# Patient Record
Sex: Male | Born: 1940 | Race: White | Hispanic: No | Marital: Married | Smoking: Former smoker
Health system: Southern US, Community
[De-identification: ages and names within clinical notes are randomized; demographics above are authoritative.]

## PROBLEM LIST (undated history)

## (undated) DIAGNOSIS — J309 Allergic rhinitis, unspecified: Secondary | ICD-10-CM

## (undated) DIAGNOSIS — E785 Hyperlipidemia, unspecified: Secondary | ICD-10-CM

## (undated) DIAGNOSIS — L570 Actinic keratosis: Secondary | ICD-10-CM

## (undated) DIAGNOSIS — I10 Essential (primary) hypertension: Secondary | ICD-10-CM

## (undated) HISTORY — DX: Essential (primary) hypertension: I10

## (undated) HISTORY — PX: TONSILECTOMY, ADENOIDECTOMY, BILATERAL MYRINGOTOMY AND TUBES: SHX2538

## (undated) HISTORY — DX: Hyperlipidemia, unspecified: E78.5

## (undated) HISTORY — DX: Allergic rhinitis, unspecified: J30.9

## (undated) HISTORY — DX: Actinic keratosis: L57.0

## (undated) HISTORY — PX: CATARACT EXTRACTION: SUR2

## (undated) HISTORY — PX: CARPAL TUNNEL RELEASE: SHX101

---

## 2017-03-17 DIAGNOSIS — H26492 Other secondary cataract, left eye: Secondary | ICD-10-CM | POA: Diagnosis not present

## 2017-07-22 DIAGNOSIS — E782 Mixed hyperlipidemia: Secondary | ICD-10-CM | POA: Diagnosis not present

## 2017-07-22 DIAGNOSIS — E538 Deficiency of other specified B group vitamins: Secondary | ICD-10-CM | POA: Diagnosis not present

## 2017-07-22 DIAGNOSIS — I119 Hypertensive heart disease without heart failure: Secondary | ICD-10-CM | POA: Diagnosis not present

## 2017-07-22 DIAGNOSIS — I2510 Atherosclerotic heart disease of native coronary artery without angina pectoris: Secondary | ICD-10-CM | POA: Diagnosis not present

## 2018-03-05 DIAGNOSIS — E538 Deficiency of other specified B group vitamins: Secondary | ICD-10-CM | POA: Diagnosis not present

## 2018-03-05 DIAGNOSIS — I2510 Atherosclerotic heart disease of native coronary artery without angina pectoris: Secondary | ICD-10-CM | POA: Diagnosis not present

## 2018-03-05 LAB — BASIC METABOLIC PANEL
BUN: 19 (ref 4–21)
Creatinine: 1.1 (ref 0.6–1.3)
Glucose: 98
Potassium: 3.9 (ref 3.4–5.3)
Sodium: 141 (ref 137–147)

## 2018-03-05 LAB — CBC
BASO#: 0 /uL
BASO%: 1 %
EOS%: 2 %
Eosinophil #: 0.17
LYMPH%: 20 %
MCH: 33.3 pg — AB (ref 26.5–32.5)
MCHC: 34.3 g/dL (ref 32.5–36.9)
MCV: 97 fL (ref 76.0–111.0)
MONO#: 0.7
Monocyte %: 10
Neutrophil #: 4.9
Neutrophil %: 67
RBC: 4.06 MIL/uL — AB (ref 4.2–5.8)
RDW: 12.2 % (ref 11.6–14)
lymph#: 1.5

## 2018-03-05 LAB — TSH: TSH: 2.97 (ref 0.41–5.90)

## 2018-03-05 LAB — LIPID PANEL
Cholesterol: 158 (ref 0–200)
HDL: 58 (ref 35–70)
LDL Cholesterol: 70
Triglycerides: 150 (ref 40–160)

## 2018-03-05 LAB — CBC AND DIFFERENTIAL
HCT: 39 — AB (ref 41–53)
Hemoglobin: 13.5 (ref 13.5–17.5)
Platelets: 256 (ref 150–399)
WBC: 7.4

## 2018-03-05 LAB — COMPREHENSIVE METABOLIC PANEL
Albumin/Globulin Ratio: 1.8
Albumin: 4.8
BUN/Creatinine Ratio: 17
Calcium: 9.8
Carbon Dioxide, Total: 27
Chloride: 101
EGFR (African American): 81
EGFR (Non-African Amer.): 67

## 2018-03-05 LAB — HEPATIC FUNCTION PANEL
ALT: 22 (ref 10–40)
AST: 23 (ref 14–40)
Alkaline Phosphatase: 87 (ref 25–125)
Bilirubin, Total: 0.7

## 2018-03-05 LAB — VITAMIN B12: Vitamin B-12: 242.2

## 2018-03-11 DIAGNOSIS — E782 Mixed hyperlipidemia: Secondary | ICD-10-CM | POA: Diagnosis not present

## 2018-03-11 DIAGNOSIS — I2510 Atherosclerotic heart disease of native coronary artery without angina pectoris: Secondary | ICD-10-CM | POA: Diagnosis not present

## 2018-03-11 DIAGNOSIS — Z0000 Encounter for general adult medical examination without abnormal findings: Secondary | ICD-10-CM | POA: Diagnosis not present

## 2018-03-11 DIAGNOSIS — I119 Hypertensive heart disease without heart failure: Secondary | ICD-10-CM | POA: Diagnosis not present

## 2018-03-18 DIAGNOSIS — D2230 Melanocytic nevi of unspecified part of face: Secondary | ICD-10-CM | POA: Diagnosis not present

## 2018-03-18 DIAGNOSIS — L578 Other skin changes due to chronic exposure to nonionizing radiation: Secondary | ICD-10-CM | POA: Diagnosis not present

## 2018-03-18 DIAGNOSIS — D229 Melanocytic nevi, unspecified: Secondary | ICD-10-CM | POA: Diagnosis not present

## 2018-03-18 DIAGNOSIS — Z1283 Encounter for screening for malignant neoplasm of skin: Secondary | ICD-10-CM | POA: Diagnosis not present

## 2018-03-18 DIAGNOSIS — D1801 Hemangioma of skin and subcutaneous tissue: Secondary | ICD-10-CM | POA: Diagnosis not present

## 2018-04-07 ENCOUNTER — Encounter: Payer: Self-pay | Admitting: Urology

## 2018-04-07 ENCOUNTER — Ambulatory Visit: Payer: Medicare Other | Admitting: Urology

## 2018-04-07 DIAGNOSIS — B356 Tinea cruris: Secondary | ICD-10-CM

## 2018-04-07 DIAGNOSIS — N4883 Acquired buried penis: Secondary | ICD-10-CM | POA: Diagnosis not present

## 2018-04-07 DIAGNOSIS — N432 Other hydrocele: Secondary | ICD-10-CM | POA: Diagnosis not present

## 2018-04-07 NOTE — Progress Notes (Signed)
04/07/2018 1:52 PM   Isaac Watkins 12-09-1940 [id number]  Referring provider: Trude Mcburney, MD [address] [place], [place] [address]  Chief Complaint  Patient presents with  . pevic swelling    HPI:  78 yo male seen for scrotal swelling. He's noticed on and off since 7th grade but worse over past few months. He has to use baby powder because of scrotal sweating. He has an adequate flow but frequency and noc x 4-5. He reports DRE was normal 3-4 weeks ago.   Modifying factors: There are no other modifying factors  Associated signs and symptoms: There are no other associated signs and symptoms Aggravating and relieving factors: There are no other aggravating or relieving factors Severity: Moderate Duration: Persistent  PMH: Past Medical History:  Diagnosis Date  . Hypertension      Surgical History Collapse by Default  Collapse by Default     Date Unknown Carpal tunnel release  Date Unknown Cataract extraction  Date Unknown Tonsilectomy, adenoidectomy, bilateral myringotomy and tubes     Home Medications:  Allergies as of 04/07/2018   No Known Allergies     Medication List       Accurate as of April 07, 2018  1:52 PM. Always use your most recent med list.        diltiazem 300 MG 24 hr capsule Commonly known as:  TIAZAC   hydrochlorothiazide 25 MG tablet Commonly known as:  HYDRODIURIL   losartan 100 MG tablet Commonly known as:  COZAAR       Allergies: No Known Allergies  Family History: Family History  Problem Relation Age of Onset  . Pancreatic cancer Father   . Prostate cancer Neg Hx   . Bladder Cancer Neg Hx   . Kidney cancer Neg Hx     Social History:  reports that he has quit smoking. He has never used smokeless tobacco. He reports current alcohol use. He reports that he does not use drugs.  ROS:                                        Physical Exam: There were no vitals taken for this  visit.  Constitutional:  Alert and oriented, No acute distress. HEENT: [place] AT, moist mucus membranes.  Trachea midline, no masses. Cardiovascular: No clubbing, cyanosis, or edema. Respiratory: Normal respiratory effort, no increased work of breathing. GI: Abdomen is soft, nontender, nondistended, no abdominal masses GU: No CVA tenderness Penis: circumcised, normal foreskin and penis; Large SP fat pad and penis somewhat buried ; testicles descended bilaterally and normal. Possible small hydroceles.  Scrotum : normal  Lymph: No cervical or inguinal lymphadenopathy. Skin: No rashes, bruises or suspicious lesions. Neurologic: Grossly intact, no focal deficits, moving all 4 extremities. Psychiatric: Normal mood and affect.  Laboratory Data: No results found for: WBC, HGB, HCT, MCV, PLT  No results found for: CREATININE  No results found for: PSA  No results found for: TESTOSTERONE  No results found for: HGBA1C  Urinalysis No results found for: COLORURINE, APPEARANCEUR, LABSPEC, PHURINE, GLUCOSEU, HGBUR, BILIRUBINUR, KETONESUR, PROTEINUR, UROBILINOGEN, NITRITE, LEUKOCYTESUR  No results found for: LABMICR, WBCUA, RBCUA, LABEPIT, MUCUS, BACTERIA  Pertinent Imaging:  No results found for this or any previous visit. No results found for this or any previous visit. No results found for this or any previous visit. No results found for this or any  previous visit. No results found for this or any previous visit. No results found for this or any previous visit. No results found for this or any previous visit. No results found for this or any previous visit.  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 Ileene Rubens at [facility].   Hydrocele - not large enough to risk hydrocelectomy  Tinea cruris - OTC antifungals recommended   Discussed PSA screening and he declined.   There are no diagnoses linked to this encounter.  No follow-ups on file.  Jerilee Field,  MD  [facility] [address] [place], [place] [address] [phone]

## 2018-04-07 NOTE — Patient Instructions (Signed)
Jock Itch  Jock itch is an itchy rash in the groin and upper thigh area. It is a skin infection that is caused by a type of germ that lives in dark, damp places (fungus). The rash usually goes away in 2-3 weeks with treatment. Follow these instructions at home: Skin care  Use skin creams, ointments, or powders exactly as told by your doctor.  Wear loose-fitting clothes. Clothes should not rub against your groin area. Men should wear boxer shorts or loose-fitting underwear.  Keep your groin area clean and dry. ? Change your underwear every day. ? Change out of wet bathing suits as soon as you can. ? After bathing, use a separate towel to dry your groin area. Dry the area gently and completely.  Avoid hot baths and showers. Hot water can make itching worse.  Do not scratch the area. General instructions  Take and apply over-the-counter and prescription medicines only as told by your doctor.  Do not share towels or clothing with other people.  Wash your hands often with soap and water, especially after touching your groin area. If you do not have soap and water, use alcohol-based hand sanitizer. Contact a doctor if:  Your rash: ? Gets worse. ? Does not get better after 2 weeks of treatment. ? Spreads. ? Comes back after treatment is done.  You have any of the following: ? A fever. ? New or worsening redness, swelling, or pain around your rash. ? Fluid, blood, or pus coming from your rash. Summary  Jock itch is an itchy rash. It affects the groin and upper thigh area.  Jock itch usually goes away in 2-3 weeks with treatment.  Keep your groin area clean and dry. This information is not intended to replace advice given to you by your health care provider. Make sure you discuss any questions you have with your health care provider. Document Released: 05/21/2009 Document Revised: 02/04/2017 Document Reviewed: 02/04/2017 Elsevier Interactive Patient Education  2019 Elsevier  Inc.   Hydrocele, Adult A hydrocele is a collection of fluid in the loose pouch of skin that holds the testicles (scrotum). This may happen because:  The amount of fluid produced in the scrotum is not absorbed by the rest of the body.  Fluid from the abdomen fills the scrotum. Normally, the testicles develop in the abdomen then move (drop) into to the scrotum before birth. The tube that the testicles travel through usually closes after the testicles drop. If the tube does not close, fluid from the abdomen can fill the scrotum. This is less common in adults. What are the causes? The cause of a hydrocele in adults is usually not known. However, it may be caused by:  An injury to the scrotum.  An infection (epididymitis).  Decreased blood flow to the scrotum.  Twisting of a testicle (testicular torsion).  A birth defect.  A tumor or cancer of the testicle. What are the signs or symptoms? A hydrocele feels like a water-filled balloon. It may also feel heavy. Other symptoms include:  Swelling of the scrotum. The swelling may decrease when you lie down. You may also notice more swelling at night than in the morning.  Swelling of the groin.  Mild discomfort in the scrotum.  Pain. This can develop if the hydrocele was caused by infection or twisting. The larger the hydrocele, the more likely you are to have pain. How is this diagnosed? This condition may be diagnosed based on:  Physical exam.  Medical history.  You may also have other tests, including:  Imaging tests, such as ultrasound.  Blood or urine tests. How is this treated? Most hydroceles go away on their own. If you have no discomfort or pain, your health care provider may suggest close monitoring of your condition (called watch and wait or watchful waiting) until the condition goes away or symptoms develop. If treatment is needed, it may include:  Treating an underlying condition. This may include using an antibiotic  medicine to treat an infection.  Surgery to stop fluid from collecting in the scrotum.  Surgery to drain the fluid. Options include: ? Needle aspiration. A needle is used to drain fluid. However, the fluid buildup will come back quickly. ? Hydrocelectomy. For this procedure, an incision is made in the scrotum to remove the fluid sac. Follow these instructions at home:  Watch the hydrocele for any changes.  Take over-the-counter and prescription medicines only as told by your health care provider.  If you were prescribed an antibiotic medicine, use it as told by your health care provider. Do not stop taking the antibiotic even if you start to feel better.  Keep all follow-up visits as told by your health care provider. This is important. Contact a health care provider if:  You notice any changes in the hydrocele.  The swelling in your scrotum or groin gets worse.  The hydrocele becomes red, firm, painful, or tender to the touch.  You have a fever. Get help right away if you:  Develop a lot of pain, or your pain becomes worse. Summary  A hydrocele is a collection of fluid in the loose pouch of skin that holds the testicles (scrotum).  Hydroceles can cause swelling, discomfort, and sometimes pain.  In adults, the cause of a hydrocele usually is not known. However, it is sometimes caused by an infection or a rotation and twisting of the scrotum.  Treatment is usually not needed. Hydroceles often go away on their own. If a hydrocele causes pain, treatment may be given to ease the pain. This information is not intended to replace advice given to you by your health care provider. Make sure you discuss any questions you have with your health care provider. Document Released: 08/14/2009 Document Revised: 03/07/2017 Document Reviewed: 03/07/2017 Elsevier Interactive Patient Education  2019 [employer].

## 2018-05-04 ENCOUNTER — Ambulatory Visit: Payer: Self-pay | Admitting: Urology

## 2018-06-23 ENCOUNTER — Ambulatory Visit: Payer: Self-pay | Admitting: Family Medicine

## 2018-07-16 ENCOUNTER — Ambulatory Visit: Payer: Self-pay | Admitting: Family Medicine

## 2018-08-05 DIAGNOSIS — L820 Inflamed seborrheic keratosis: Secondary | ICD-10-CM | POA: Diagnosis not present

## 2018-08-05 DIAGNOSIS — L578 Other skin changes due to chronic exposure to nonionizing radiation: Secondary | ICD-10-CM | POA: Diagnosis not present

## 2018-09-20 ENCOUNTER — Ambulatory Visit (INDEPENDENT_AMBULATORY_CARE_PROVIDER_SITE_OTHER): Payer: Medicare Other | Admitting: Family Medicine

## 2018-09-20 ENCOUNTER — Other Ambulatory Visit: Payer: Self-pay

## 2018-09-20 ENCOUNTER — Encounter: Payer: Self-pay | Admitting: Family Medicine

## 2018-09-20 VITALS — BP 126/78 | HR 75 | Temp 97.9°F | Resp 16 | Ht 72.0 in | Wt 234.0 lb

## 2018-09-20 DIAGNOSIS — F4323 Adjustment disorder with mixed anxiety and depressed mood: Secondary | ICD-10-CM | POA: Diagnosis not present

## 2018-09-20 DIAGNOSIS — F4320 Adjustment disorder, unspecified: Secondary | ICD-10-CM | POA: Insufficient documentation

## 2018-09-20 DIAGNOSIS — N401 Enlarged prostate with lower urinary tract symptoms: Secondary | ICD-10-CM | POA: Insufficient documentation

## 2018-09-20 DIAGNOSIS — I7090 Unspecified atherosclerosis: Secondary | ICD-10-CM

## 2018-09-20 DIAGNOSIS — I10 Essential (primary) hypertension: Secondary | ICD-10-CM | POA: Diagnosis not present

## 2018-09-20 DIAGNOSIS — R351 Nocturia: Secondary | ICD-10-CM

## 2018-09-20 DIAGNOSIS — J301 Allergic rhinitis due to pollen: Secondary | ICD-10-CM | POA: Insufficient documentation

## 2018-09-20 MED ORDER — CITALOPRAM HYDROBROMIDE 20 MG PO TABS: 20.0000 mg | ORAL_TABLET | Freq: Every day | ORAL | 3 refills | Status: DC

## 2018-09-20 MED ORDER — DILTIAZEM HCL ER BEADS 300 MG PO CP24: 300.0000 mg | ORAL_CAPSULE | Freq: Every day | ORAL | 3 refills | Status: DC

## 2018-09-20 MED ORDER — HYDROCHLOROTHIAZIDE 25 MG PO TABS: 25.0000 mg | ORAL_TABLET | Freq: Every day | ORAL | 3 refills | Status: DC

## 2018-09-20 MED ORDER — LOSARTAN POTASSIUM 100 MG PO TABS: 100.0000 mg | ORAL_TABLET | Freq: Every day | ORAL | 3 refills | Status: DC

## 2018-09-20 MED ORDER — ATORVASTATIN CALCIUM 40 MG PO TABS: 40.0000 mg | ORAL_TABLET | Freq: Every day | ORAL | 3 refills | Status: DC

## 2018-09-20 MED ORDER — TAMSULOSIN HCL 0.4 MG PO CAPS: 0.4000 mg | ORAL_CAPSULE | Freq: Every day | ORAL | 3 refills | Status: DC

## 2018-09-20 NOTE — Assessment & Plan Note (Signed)
Ongoing issue Discussed LUTS and how BPH contributes to this Discussed longer trial of Flomax and patient agrees Will resume Flomax 0.4mg  daily F/u in 6 months

## 2018-09-20 NOTE — Progress Notes (Signed)
Patient: Isaac AdasSteve Gagliano Male    DOB: [birth year]   78 y.o.   MRN: [id number] Visit Date: 09/20/2018  Today's Provider: Shirlee LatchAngela Persephanie Laatsch, MD   Chief Complaint  Patient presents with  . Establish Care   Subjective:     HPI   Patient is here to establish care.  [place]  Recently moved to the area.   Taking atorvastatin for >2896yr for atherosclerotic disease for primary prevention.  Reports cholesterol is well controlled, even before starting this medication.  Reports good compliance and denies side effects  HTN: - Medications: Diltiazem 300mg  daily, Losartan 100mg  daily, HCTZ 25mg  daily - Compliance: good - Checking BP at home: no - Denies any SOB, CP, vision changes, LE edema, medication SEs, or symptoms of hypotension - Diet: low sodium - Exercise: walking daily   Nocturia x4-5 each night. 1-2 years of duration.  Was noted to have BPH and started on Flomax by previous PCP.  Patient stopped after 1-2 weeks as he was not feeling better.  On Celexa 20mg  daily for several years.  Feels like this helps regulate mood.  Denies any depression/anxiety symptoms.    No Known Allergies   Current Outpatient Medications:  .  atorvastatin (LIPITOR) 40 MG tablet, Take 1 tablet (40 mg total) by mouth daily at 6 PM., Disp: 90 tablet, Rfl: 3 .  citalopram (CELEXA) 20 MG tablet, Take 1 tablet (20 mg total) by mouth daily., Disp: 90 tablet, Rfl: 3 .  diltiazem (TIAZAC) 300 MG 24 hr capsule, Take 1 capsule (300 mg total) by mouth daily., Disp: 90 capsule, Rfl: 3 .  hydrochlorothiazide (HYDRODIURIL) 25 MG tablet, Take 1 tablet (25 mg total) by mouth daily., Disp: 90 tablet, Rfl: 3 .  losartan (COZAAR) 100 MG tablet, Take 1 tablet (100 mg total) by mouth daily., Disp: 90 tablet, Rfl: 3 .  tamsulosin (FLOMAX) 0.4 MG CAPS capsule, Take 1 capsule (0.4 mg total) by mouth daily., Disp: 30 capsule, Rfl: 3  Review of Systems  HENT: Positive for sneezing  and tinnitus.   Respiratory: Positive for shortness of breath.   Endocrine: Positive for polyuria.  Musculoskeletal: Positive for arthralgias and back pain.  All other systems reviewed and are negative.   Social History   Tobacco Use  . Smoking status: Former Smoker    Packs/day: 1.00    Years: 40.00    Pack years: 40.00    Types: Cigarettes    Quit date: 08/18/2011    Years since quitting: 7.0  . Smokeless tobacco: Never Used  Substance Use Topics  . Alcohol use: Yes    Alcohol/week: 3.0 standard drinks    Types: 3 Standard drinks or equivalent per week      Objective:   BP 126/78 (BP Location: Left Arm, Patient Position: Sitting, Cuff Size: Large)   Pulse 75   Temp 97.9 F (36.6 C) (Oral)   Resp 16   Ht 6' (1.829 m)   Wt 234 lb (106.1 kg)   SpO2 94%   BMI 31.74 kg/m  Vitals:   09/20/18 1143  BP: 126/78  Pulse: 75  Resp: 16  Temp: 97.9 F (36.6 C)  TempSrc: Oral  SpO2: 94%  Weight: 234 lb (106.1 kg)  Height: 6' (1.829 m)     Physical Exam Vitals signs reviewed.  Constitutional:      General: He is not in acute distress.    Appearance: Normal appearance. He is  well-developed. He is not diaphoretic.  HENT:     Head: Normocephalic and atraumatic.     Right Ear: Tympanic membrane, ear canal and external ear normal.     Left Ear: Tympanic membrane, ear canal and external ear normal.     Mouth/Throat:     Pharynx: No oropharyngeal exudate.  Eyes:     General: No scleral icterus.    Conjunctiva/sclera: Conjunctivae normal.     Pupils: Pupils are equal, round, and reactive to light.  Neck:     Musculoskeletal: Neck supple.     Thyroid: No thyromegaly.  Cardiovascular:     Rate and Rhythm: Normal rate and regular rhythm.     Pulses: Normal pulses.     Heart sounds: Normal heart sounds. No murmur.  Pulmonary:     Effort: Pulmonary effort is normal. No respiratory distress.     Breath sounds: Normal breath sounds. No wheezing or rales.  Abdominal:      General: Bowel sounds are normal. There is no distension.     Palpations: Abdomen is soft.     Tenderness: There is no abdominal tenderness. There is no guarding or rebound.  Musculoskeletal:        General: No deformity.     Right lower leg: No edema.     Left lower leg: No edema.  Lymphadenopathy:     Cervical: No cervical adenopathy.  Skin:    General: Skin is warm and dry.     Capillary Refill: Capillary refill takes less than 2 seconds.     Findings: No rash.  Neurological:     Mental Status: He is alert and oriented to person, place, and time. Mental status is at baseline.  Psychiatric:        Mood and Affect: Mood normal.        Behavior: Behavior normal.        Thought Content: Thought content normal.      No results found for any visits on 09/20/18.     Assessment & Plan   Problem List Items Addressed This Visit      Cardiovascular and Mediastinum   Hypertension - Primary    Well controlled Continue current medications Recheck metabolic panel F/u in 6 months       Relevant Medications   atorvastatin (LIPITOR) 40 MG tablet   diltiazem (TIAZAC) 300 MG 24 hr capsule   hydrochlorothiazide (HYDRODIURIL) 25 MG tablet   losartan (COZAAR) 100 MG tablet   Other Relevant Orders   Basic Metabolic Panel (BMET)   Atherosclerosis    Reports normal cholesterol Will abstract last labs from PCP office Recheck FLP and CMP at next CPE      Relevant Medications   atorvastatin (LIPITOR) 40 MG tablet   diltiazem (TIAZAC) 300 MG 24 hr capsule   hydrochlorothiazide (HYDRODIURIL) 25 MG tablet   losartan (COZAAR) 100 MG tablet     Respiratory   Seasonal allergic rhinitis due to pollen    Chronic and fairly well controlled, but has daily sneezing Discussed flonase        Other   Adjustment disorder    Chronic, well controlled Continue Celexa Repeat PHQ9 at CPE      BPH associated with nocturia    Ongoing issue Discussed LUTS and how BPH contributes to this  Discussed longer trial of Flomax and patient agrees Will resume Flomax 0.4mg  daily F/u in 6 months         Approximately 45 minutes was spent in  discussion of which greater than 50% was consultation.     Return in about 6 months (around 03/23/2019) for CPE/AWV.   The entirety of the information documented in the History of Present Illness, Review of Systems and Physical Exam were personally obtained by me. Portions of this information were initially documented by April Miller, CMA and reviewed by me for thoroughness and accuracy.    Jlen Wintle, Marzella SchleinAngela M, MD MPH [facility]

## 2018-09-20 NOTE — Assessment & Plan Note (Signed)
Reports normal cholesterol Will abstract last labs from PCP office Recheck FLP and CMP at next CPE

## 2018-09-20 NOTE — Assessment & Plan Note (Signed)
Well controlled Continue current medications Recheck metabolic panel F/u in 6 months  

## 2018-09-20 NOTE — Assessment & Plan Note (Signed)
Chronic and fairly well controlled, but has daily sneezing Discussed flonase

## 2018-09-20 NOTE — Assessment & Plan Note (Signed)
Chronic, well controlled Continue Celexa Repeat PHQ9 at CPE

## 2018-09-20 NOTE — Patient Instructions (Signed)
Try flonase for sneezing  Start Flomax for urination   Benign Prostatic Hyperplasia  Benign prostatic hyperplasia (BPH) is an enlarged prostate gland that is caused by the normal aging process and not by cancer. The prostate is a walnut-sized gland that is involved in the production of semen. It is located in front of the rectum and below the bladder. The bladder stores urine and the urethra is the tube that carries the urine out of the body. The prostate may get bigger as a man gets older. An enlarged prostate can press on the urethra. This can make it harder to pass urine. The build-up of urine in the bladder can cause infection. Back pressure and infection may progress to bladder damage and kidney (renal) failure. What are the causes? This condition is part of a normal aging process. However, not all men develop problems from this condition. If the prostate enlarges away from the urethra, urine flow will not be blocked. If it enlarges toward the urethra and compresses it, there will be problems passing urine. What increases the risk? This condition is more likely to develop in men over the age of 57 years. What are the signs or symptoms? Symptoms of this condition include:  Getting up often during the night to urinate.  Needing to urinate frequently during the day.  Difficulty starting urine flow.  Decrease in size and strength of your urine stream.  Leaking (dribbling) after urinating.  Inability to pass urine. This needs immediate treatment.  Inability to completely empty your bladder.  Pain when you pass urine. This is more common if there is also an infection.  Urinary tract infection (UTI). How is this diagnosed? This condition is diagnosed based on your medical history, a physical exam, and your symptoms. Tests will also be done, such as:  A post-void bladder scan. This measures any amount of urine that may remain in your bladder after you finish urinating.  A digital  rectal exam. In a rectal exam, your health care provider checks your prostate by putting a lubricated, gloved finger into your rectum to feel the back of your prostate gland. This exam detects the size of your gland and any abnormal lumps or growths.  An exam of your urine (urinalysis).  A prostate specific antigen (PSA) screening. This is a blood test used to screen for prostate cancer.  An ultrasound. This test uses sound waves to electronically produce a picture of your prostate gland. Your health care provider may refer you to a specialist in kidney and prostate diseases (urologist). How is this treated? Once symptoms begin, your health care provider will monitor your condition (active surveillance or watchful waiting). Treatment for this condition will depend on the severity of your condition. Treatment may include:  Observation and yearly exams. This may be the only treatment needed if your condition and symptoms are mild.  Medicines to relieve your symptoms, including: ? Medicines to shrink the prostate. ? Medicines to relax the muscle of the prostate.  Surgery in severe cases. Surgery may include: ? Prostatectomy. In this procedure, the prostate tissue is removed completely through an open incision or with a laparoscope or robotics. ? Transurethral resection of the prostate (TURP). In this procedure, a tool is inserted through the opening at the tip of the penis (urethra). It is used to cut away tissue of the inner core of the prostate. The pieces are removed through the same opening of the penis. This removes the blockage. ? Transurethral incision (TUIP). In  this procedure, small cuts are made in the prostate. This lessens the prostate's pressure on the urethra. ? Transurethral microwave thermotherapy (TUMT). This procedure uses microwaves to create heat. The heat destroys and removes a small amount of prostate tissue. ? Transurethral needle ablation (TUNA). This procedure uses radio  frequencies to destroy and remove a small amount of prostate tissue. ? Interstitial laser coagulation (ILC). This procedure uses a laser to destroy and remove a small amount of prostate tissue. ? Transurethral electrovaporization (TUVP). This procedure uses electrodes to destroy and remove a small amount of prostate tissue. ? Prostatic urethral lift. This procedure inserts an implant to push the lobes of the prostate away from the urethra. Follow these instructions at home:  Take over-the-counter and prescription medicines only as told by your health care provider.  Monitor your symptoms for any changes. Contact your health care provider with any changes.  Avoid drinking large amounts of liquid before going to bed or out in public.  Avoid or reduce how much caffeine or alcohol you drink.  Give yourself time when you urinate.  Keep all follow-up visits as told by your health care provider. This is important. Contact a health care provider if:  You have unexplained back pain.  Your symptoms do not get better with treatment.  You develop side effects from the medicine you are taking.  Your urine becomes very dark or has a bad smell.  Your lower abdomen becomes distended and you have trouble passing your urine. Get help right away if:  You have a fever or chills.  You suddenly cannot urinate.  You feel lightheaded, or very dizzy, or you faint.  There are large amounts of blood or clots in the urine.  Your urinary problems become hard to manage.  You develop moderate to severe low back or flank pain. The flank is the side of your body between the ribs and the hip. These symptoms may represent a serious problem that is an emergency. Do not wait to see if the symptoms will go away. Get medical help right away. Call your local emergency services (911 in the [place] Do not drive yourself to the hospital. Summary  Benign prostatic hyperplasia (BPH) is an enlarged prostate that is  caused by the normal aging process and not by cancer.  An enlarged prostate can press on the urethra. This can make it hard to pass urine.  This condition is part of a normal aging process and is more likely to develop in men over the age of 50 years.  Get help right away if you suddenly cannot urinate. This information is not intended to replace advice given to you by your health care provider. Make sure you discuss any questions you have with your health care provider. Document Released: 02/24/2005 Document Revised: 01/19/2018 Document Reviewed: 03/31/2016 Elsevier Patient Education  2020 [employer]

## 2018-09-21 LAB — BASIC METABOLIC PANEL
BUN/Creatinine Ratio: 14 (ref 10–24)
BUN: 20 mg/dL (ref 8–27)
CO2: 22 mmol/L (ref 20–29)
Calcium: 10.1 mg/dL (ref 8.6–10.2)
Chloride: 98 mmol/L (ref 96–106)
Creatinine, Ser: 1.4 mg/dL — ABNORMAL HIGH (ref 0.76–1.27)
GFR calc Af Amer: 56 mL/min/{1.73_m2} — ABNORMAL LOW (ref >59)
GFR calc non Af Amer: 49 mL/min/{1.73_m2} — ABNORMAL LOW (ref >59)
Glucose: 92 mg/dL (ref 65–99)
Potassium: 4.1 mmol/L (ref 3.5–5.2)
Sodium: 138 mmol/L (ref 134–144)

## 2018-09-22 ENCOUNTER — Other Ambulatory Visit: Payer: Self-pay

## 2018-09-22 ENCOUNTER — Telehealth: Payer: Self-pay

## 2018-09-22 ENCOUNTER — Encounter: Payer: Self-pay | Admitting: *Deleted

## 2018-09-22 DIAGNOSIS — R7989 Other specified abnormal findings of blood chemistry: Secondary | ICD-10-CM

## 2018-09-22 NOTE — Telephone Encounter (Signed)
Labs order and slip left up front.

## 2018-09-22 NOTE — Telephone Encounter (Signed)
Patient given lab results. He states that he will try to drink more water, and will start Flomax tomorrow. He will have labs rechecked in 2 weeks.

## 2018-09-22 NOTE — Telephone Encounter (Signed)
-----   Message from Virginia Crews, MD sent at 09/22/2018 10:06 AM EDT ----- Kidney funciton has slightly worsened since last checked ~6 months ago.  Want to make sure that patient is staying well hydrated.  Start flomax as discussed and plan to recheck BMP in 2 weeks.

## 2018-09-22 NOTE — Telephone Encounter (Signed)
We can go ahead and order the BMP for elevated serum creatinine and leave lab slip up front

## 2018-09-28 DIAGNOSIS — Z961 Presence of intraocular lens: Secondary | ICD-10-CM | POA: Diagnosis not present

## 2018-09-28 DIAGNOSIS — H524 Presbyopia: Secondary | ICD-10-CM | POA: Diagnosis not present

## 2019-01-24 NOTE — Progress Notes (Signed)
Subjective:   Isaac Watkins is a 78 y.o. male who presents for an Initial Medicare Annual Wellness Visit.    This visit is being conducted through telemedicine due to the COVID-19 pandemic. This patient has given me verbal consent via doximity to conduct this visit, patient states they are participating from their home address. Some vital signs may be absent or patient reported.    Patient identification: identified by name, DOB, and current address  Review of Systems  N/A  Cardiac Risk Factors include: advanced age (>74mn, >>71women);dyslipidemia;hypertension;male gender;sedentary lifestyle    Objective:    Today's Vitals   01/25/19 1326  PainSc: 0-No pain   There is no height or weight on file to calculate BMI. Unable to obtain vitals due to visit being conducted via telephonically.   Advanced Directives 01/25/2019  Does Patient Have a Medical Advance Directive? Yes  Type of Advance Directive Living will    Current Medications (verified) Outpatient Encounter Medications as of 01/25/2019  Medication Sig  . atorvastatin (LIPITOR) 40 MG tablet Take 1 tablet (40 mg total) by mouth daily at 6 PM.  . citalopram (CELEXA) 20 MG tablet Take 1 tablet (20 mg total) by mouth daily.  .Marland Kitchendiltiazem (TIAZAC) 300 MG 24 hr capsule Take 1 capsule (300 mg total) by mouth daily.  . hydrochlorothiazide (HYDRODIURIL) 25 MG tablet Take 1 tablet (25 mg total) by mouth daily.  .Marland Kitchenlosartan (COZAAR) 100 MG tablet Take 1 tablet (100 mg total) by mouth daily.  . tamsulosin (FLOMAX) 0.4 MG CAPS capsule Take 1 capsule (0.4 mg total) by mouth daily.   No facility-administered encounter medications on file as of 01/25/2019.     Allergies (verified) Patient has no known allergies.   History: Past Medical History:  Diagnosis Date  . Allergic rhinitis   . Hyperlipidemia   . Hypertension    Past Surgical History:  Procedure Laterality Date  . CARPAL TUNNEL RELEASE Left   . CATARACT EXTRACTION     . TONSILECTOMY, ADENOIDECTOMY, BILATERAL MYRINGOTOMY AND TUBES     Family History  Problem Relation Age of Onset  . Pancreatic cancer Father   . Pancreatic disease Father   . COPD Father   . Dementia Mother   . Breast cancer Sister        BRCA 2 +  . Brain cancer Paternal Grandfather   . Prostate cancer Neg Hx   . Bladder Cancer Neg Hx   . Kidney cancer Neg Hx    Social History   Socioeconomic History  . Marital status: Married    Spouse name: Not on file  . Number of children: 3  . Years of education: Not on file  . Highest education level: Master's degree (e.g., MA, MS, MEng, MEd, MSW, MBA)  Occupational History  . Occupation: retired    Comment: owned pMidwife Social Needs  . Financial resource strain: Not hard at all  . Food insecurity    Worry: Never true    Inability: Never true  . Transportation needs    Medical: No    Non-medical: No  Tobacco Use  . Smoking status: Former Smoker    Packs/day: 1.00    Years: 40.00    Pack years: 40.00    Types: Cigarettes    Quit date: 08/18/2011    Years since quitting: 7.4  . Smokeless tobacco: Never Used  Substance and Sexual Activity  . Alcohol use: Yes    Alcohol/week: 21.0 standard drinks  Types: 21 Shots of liquor per week    Comment: 3 whiskey drinks a day  . Drug use: Never  . Sexual activity: Yes    Partners: Female    Birth control/protection: None  Lifestyle  . Physical activity    Days per week: 0 days    Minutes per session: 0 min  . Stress: Not at all  Relationships  . Social Herbalist on phone: Patient refused    Gets together: Patient refused    Attends religious service: Patient refused    Active member of club or organization: Patient refused    Attends meetings of clubs or organizations: Patient refused    Relationship status: Patient refused  Other Topics Concern  . Not on file  Social History Narrative  . Not on file   Tobacco Counseling Counseling given:  Not Answered   Clinical Intake:  Pre-visit preparation completed: Yes  Pain : No/denies pain Pain Score: 0-No pain     Nutritional Risks: None Diabetes: No  How often do you need to have someone help you when you read instructions, pamphlets, or other written materials from your doctor or pharmacy?: 1 - Never  Interpreter Needed?: No  Information entered by :: [facility], LPN  Activities of Daily Living In your present state of health, do you have any difficulty performing the following activities: 01/25/2019 09/20/2018  Hearing? N Y  Vision? N N  Difficulty concentrating or making decisions? N N  Walking or climbing stairs? Y Y  Comment Due to SOB. -  Dressing or bathing? N N  Doing errands, shopping? N N  Preparing Food and eating ? N -  Using the Toilet? N -  In the past six months, have you accidently leaked urine? N -  Do you have problems with loss of bowel control? N -  Managing your Medications? N -  Managing your Finances? N -  Housekeeping or managing your Housekeeping? N -     Immunizations and Health Maintenance Immunization History  Administered Date(s) Administered  . Hepatitis A, Adult 01/13/2011, 07/10/2011  . Pneumococcal Conjugate-13 10/20/2013  . Pneumococcal Polysaccharide-23 02/08/2009, 11/22/2014, 01/10/2018  . Tdap 01/03/2016  . Zoster 08/12/2008  . Zoster Recombinat (Shingrix) 03/19/2017, 08/20/2017   Health Maintenance Due  Topic Date Due  . COLONOSCOPY  02/28/1993  . INFLUENZA VACCINE  10/09/2018    Patient Care Team: Virginia Crews, MD as PCP - General (Family Medicine) Ralene Bathe, MD (Dermatology)  Indicate any recent Medical Services you may have received from other than [facility] providers in the past year (date may be approximate).    Assessment:   This is a routine wellness examination for Isaac Watkins.  Hearing/Vision screen No exam data present  Dietary issues and exercise activities discussed: Current Exercise  Habits: The patient does not participate in regular exercise at present, Exercise limited by: None identified  Goals    . DIET - INCREASE WATER INTAKE     Recommend to drink at least 6-8 8oz glasses of water per day.      Depression Screen PHQ 2/9 Scores 01/25/2019 09/20/2018  PHQ - 2 Score 0 0  PHQ- 9 Score - 0    Fall Risk Fall Risk  01/25/2019 09/20/2018  Falls in the past year? 0 0  Number falls in past yr: 0 0  Injury with Fall? 0 -    FALL RISK PREVENTION PERTAINING TO THE HOME:  Any stairs in or around the home? No  If so, are there any without handrails? N/A  Home free of loose throw rugs in walkways, pet beds, electrical cords, etc? Yes  Adequate lighting in your home to reduce risk of falls? Yes   ASSISTIVE DEVICES UTILIZED TO PREVENT FALLS:  Life alert? No  Use of a cane, walker or w/c? No  Grab bars in the bathroom? No  Shower chair or bench in shower? No  Elevated toilet seat or a handicapped toilet? No    TIMED UP AND GO:  Was the test performed? No .    Cognitive Function:     6CIT Screen 01/25/2019  What Year? 0 points  What month? 0 points  What time? 0 points  Count back from 20 0 points  Months in reverse 0 points  Repeat phrase 0 points  Total Score 0    Screening Tests Health Maintenance  Topic Date Due  . COLONOSCOPY  02/28/1993  . INFLUENZA VACCINE  10/09/2018  . TETANUS/TDAP  01/02/2026  . PNA vac Low Risk Adult  Completed    Qualifies for Shingles Vaccine? Completed series  Tdap: Up to date  Flu Vaccine: Due for Flu vaccine. Does the patient want to receive this vaccine today?  No .   Pneumococcal Vaccine: Completed series  Cancer Screenings:  Colorectal Screening: Referral to GI placed today. Pt aware the office will call re: appt.  Lung Cancer Screening: (Low Dose CT Chest recommended if Age 35-80 years, 30 pack-year currently smoking OR have quit w/in 15years.) does qualify. An Epic message has been sent to Burgess Estelle, RN (Oncology Nurse Navigator) regarding the possible need for this exam. Raquel Sarna will review the patient's chart to determine if the patient truly qualifies for the exam. If the patient qualifies, Raquel Sarna will order the Low Dose CT of the chest to facilitate the scheduling of this exam.   Additional Screening:  Vision Screening: Recommended annual ophthalmology exams for early detection of glaucoma and other disorders of the eye.  Dental Screening: Recommended annual dental exams for proper oral hygiene  Community Resource Referral:  CRR required this visit?  No        Plan:  I have personally reviewed and addressed the Medicare Annual Wellness questionnaire and have noted the following in the patient's chart:  A. Medical and social history B. Use of alcohol, tobacco or illicit drugs  C. Current medications and supplements D. Functional ability and status E.  Nutritional status F.  Physical activity G. Advance directives H. List of other physicians I.  Hospitalizations, surgeries, and ER visits in previous 12 months J.  [place] such as hearing and vision if needed, cognitive and depression L. Referrals and appointments   In addition, I have reviewed and discussed with patient certain preventive protocols, quality metrics, and best practice recommendations. A written personalized care plan for preventive services as well as general preventive health recommendations were provided to patient.   [place]ora Score, [place]   15/40/0867  Nurse Health Advisor   Nurse Notes: Pt to receive flu shot at tomorrows apt.

## 2019-01-25 ENCOUNTER — Other Ambulatory Visit: Payer: Self-pay

## 2019-01-25 ENCOUNTER — Ambulatory Visit (INDEPENDENT_AMBULATORY_CARE_PROVIDER_SITE_OTHER): Payer: Medicare Other

## 2019-01-25 DIAGNOSIS — Z1211 Encounter for screening for malignant neoplasm of colon: Secondary | ICD-10-CM

## 2019-01-25 DIAGNOSIS — Z0000 Encounter for general adult medical examination without abnormal findings: Secondary | ICD-10-CM | POA: Diagnosis not present

## 2019-01-25 NOTE — Patient Instructions (Signed)
Mr. Isaac Watkins , Thank you for taking time to come for your Medicare Wellness Visit. I appreciate your ongoing commitment to your health goals. Please review the following plan we discussed and let me know if I can assist you in the future.   Screening recommendations/referrals: Colonoscopy: Referral to GI placed today. Pt aware the office will call re: appt. Recommended yearly ophthalmology/optometry visit for glaucoma screening and checkup Recommended yearly dental visit for hygiene and checkup  Vaccinations: Influenza vaccine: Currently due. Pt to receive at [facility] tomorrow.  Pneumococcal vaccine: Completed series Tdap vaccine: Up to date, due 12/2025 Shingles vaccine: Completed series    Advanced directives: Please bring a copy of your POA (Power of Attorney) and/or Living Will to your next appointment.   Conditions/risks identified: Recommend to drink at least 6-8 8oz glasses of water per day.  Next appointment: 01/26/19 @ 2:40 PM with Dr Brita Romp.   Preventive Care 60 Years and Older, Male Preventive care refers to lifestyle choices and visits with your health care provider that can promote health and wellness. What does preventive care include?  A yearly physical exam. This is also called an annual well check.  Dental exams once or twice a year.  Routine eye exams. Ask your health care provider how often you should have your eyes checked.  Personal lifestyle choices, including:  Daily care of your teeth and gums.  Regular physical activity.  Eating a healthy diet.  Avoiding tobacco and drug use.  Limiting alcohol use.  Practicing safe sex.  Taking low doses of aspirin every day.  Taking vitamin and mineral supplements as recommended by your health care provider. What happens during an annual well check? The services and screenings done by your health care provider during your annual well check will depend on your age, overall health, lifestyle risk factors, and  family history of disease. Counseling  Your health care provider may ask you questions about your:  Alcohol use.  Tobacco use.  Drug use.  Emotional well-being.  Home and relationship well-being.  Sexual activity.  Eating habits.  History of falls.  Memory and ability to understand (cognition).  Work and work Statistician. Screening  You may have the following tests or measurements:  Height, weight, and BMI.  Blood pressure.  Lipid and cholesterol levels. These may be checked every 5 years, or more frequently if you are over 61 years old.  Skin check.  Lung cancer screening. You may have this screening every year starting at age 29 if you have a 30-pack-year history of smoking and currently smoke or have quit within the past 15 years.  Fecal occult blood test (FOBT) of the stool. You may have this test every year starting at age 58.  Flexible sigmoidoscopy or colonoscopy. You may have a sigmoidoscopy every 5 years or a colonoscopy every 10 years starting at age 56.  Prostate cancer screening. Recommendations will vary depending on your family history and other risks.  Hepatitis C blood test.  Hepatitis B blood test.  Sexually transmitted disease (STD) testing.  Diabetes screening. This is done by checking your blood sugar (glucose) after you have not eaten for a while (fasting). You may have this done every 1-3 years.  Abdominal aortic aneurysm (AAA) screening. You may need this if you are a current or former smoker.  Osteoporosis. You may be screened starting at age 29 if you are at high risk. Talk with your health care provider about your test results, treatment options, and if necessary,  the need for more tests. Vaccines  Your health care provider may recommend certain vaccines, such as:  Influenza vaccine. This is recommended every year.  Tetanus, diphtheria, and acellular pertussis (Tdap, Td) vaccine. You may need a Td booster every 10 years.  Zoster  vaccine. You may need this after age 85.  Pneumococcal 13-valent conjugate (PCV13) vaccine. One dose is recommended after age 48.  Pneumococcal polysaccharide (PPSV23) vaccine. One dose is recommended after age 83. Talk to your health care provider about which screenings and vaccines you need and how often you need them. This information is not intended to replace advice given to you by your health care provider. Make sure you discuss any questions you have with your health care provider. Document Released: 03/23/2015 Document Revised: 11/14/2015 Document Reviewed: 12/26/2014 Elsevier Interactive Patient Education  2017 [employer] Prevention in the Home Falls can cause injuries. They can happen to people of all ages. There are many things you can do to make your home safe and to help prevent falls. What can I do on the outside of my home?  Regularly fix the edges of walkways and driveways and fix any cracks.  Remove anything that might make you trip as you walk through a door, such as a raised step or threshold.  Trim any bushes or trees on the path to your home.  Use bright outdoor lighting.  Clear any walking paths of anything that might make someone trip, such as rocks or tools.  Regularly check to see if handrails are loose or broken. Make sure that both sides of any steps have handrails.  Any raised decks and porches should have guardrails on the edges.  Have any leaves, snow, or ice cleared regularly.  Use sand or salt on walking paths during winter.  Clean up any spills in your garage right away. This includes oil or grease spills. What can I do in the bathroom?  Use night lights.  Install grab bars by the toilet and in the tub and shower. Do not use towel bars as grab bars.  Use non-skid mats or decals in the tub or shower.  If you need to sit down in the shower, use a plastic, non-slip stool.  Keep the floor dry. Clean up any water that spills on the  floor as soon as it happens.  Remove soap buildup in the tub or shower regularly.  Attach bath mats securely with double-sided non-slip rug tape.  Do not have throw rugs and other things on the floor that can make you trip. What can I do in the bedroom?  Use night lights.  Make sure that you have a light by your bed that is easy to reach.  Do not use any sheets or blankets that are too big for your bed. They should not hang down onto the floor.  Have a firm chair that has side arms. You can use this for support while you get dressed.  Do not have throw rugs and other things on the floor that can make you trip. What can I do in the kitchen?  Clean up any spills right away.  Avoid walking on wet floors.  Keep items that you use a lot in easy-to-reach places.  If you need to reach something above you, use a strong step stool that has a grab bar.  Keep electrical cords out of the way.  Do not use floor polish or wax that makes floors slippery. If you must use  wax, use non-skid floor wax.  Do not have throw rugs and other things on the floor that can make you trip. What can I do with my stairs?  Do not leave any items on the stairs.  Make sure that there are handrails on both sides of the stairs and use them. Fix handrails that are broken or loose. Make sure that handrails are as long as the stairways.  Check any carpeting to make sure that it is firmly attached to the stairs. Fix any carpet that is loose or worn.  Avoid having throw rugs at the top or bottom of the stairs. If you do have throw rugs, attach them to the floor with carpet tape.  Make sure that you have a light switch at the top of the stairs and the bottom of the stairs. If you do not have them, ask someone to add them for you. What else can I do to help prevent falls?  Wear shoes that:  Do not have high heels.  Have rubber bottoms.  Are comfortable and fit you well.  Are closed at the toe. Do not wear  sandals.  If you use a stepladder:  Make sure that it is fully opened. Do not climb a closed stepladder.  Make sure that both sides of the stepladder are locked into place.  Ask someone to hold it for you, if possible.  Clearly mark and make sure that you can see:  Any grab bars or handrails.  First and last steps.  Where the edge of each step is.  Use tools that help you move around (mobility aids) if they are needed. These include:  Canes.  Walkers.  Scooters.  Crutches.  Turn on the lights when you go into a dark area. Replace any light bulbs as soon as they burn out.  Set up your furniture so you have a clear path. Avoid moving your furniture around.  If any of your floors are uneven, fix them.  If there are any pets around you, be aware of where they are.  Review your medicines with your doctor. Some medicines can make you feel dizzy. This can increase your chance of falling. Ask your doctor what other things that you can do to help prevent falls. This information is not intended to replace advice given to you by your health care provider. Make sure you discuss any questions you have with your health care provider. Document Released: 12/21/2008 Document Revised: 08/02/2015 Document Reviewed: 03/31/2014 Elsevier Interactive Patient Education  2017 [employer].

## 2019-01-26 ENCOUNTER — Telehealth: Payer: Self-pay | Admitting: *Deleted

## 2019-01-26 ENCOUNTER — Other Ambulatory Visit: Payer: Self-pay

## 2019-01-26 ENCOUNTER — Ambulatory Visit: Payer: Medicare Other | Admitting: Family Medicine

## 2019-01-26 ENCOUNTER — Ambulatory Visit (INDEPENDENT_AMBULATORY_CARE_PROVIDER_SITE_OTHER): Payer: Medicare Other | Admitting: Family Medicine

## 2019-01-26 ENCOUNTER — Encounter: Payer: Self-pay | Admitting: Family Medicine

## 2019-01-26 VITALS — BP 121/77 | HR 86 | Temp 96.8°F | Wt 234.0 lb

## 2019-01-26 DIAGNOSIS — Z87891 Personal history of nicotine dependence: Secondary | ICD-10-CM

## 2019-01-26 DIAGNOSIS — Z122 Encounter for screening for malignant neoplasm of respiratory organs: Secondary | ICD-10-CM

## 2019-01-26 DIAGNOSIS — Z23 Encounter for immunization: Secondary | ICD-10-CM | POA: Diagnosis not present

## 2019-01-26 DIAGNOSIS — I10 Essential (primary) hypertension: Secondary | ICD-10-CM | POA: Diagnosis not present

## 2019-01-26 DIAGNOSIS — Z6831 Body mass index (BMI) 31.0-31.9, adult: Secondary | ICD-10-CM

## 2019-01-26 DIAGNOSIS — Z0000 Encounter for general adult medical examination without abnormal findings: Secondary | ICD-10-CM | POA: Diagnosis not present

## 2019-01-26 DIAGNOSIS — E669 Obesity, unspecified: Secondary | ICD-10-CM | POA: Diagnosis not present

## 2019-01-26 NOTE — Assessment & Plan Note (Signed)
Well controlled Continue current medications Recheck metabolic panel F/u in 6 months  

## 2019-01-26 NOTE — Progress Notes (Signed)
Patient: Isaac Watkins, Male    DOB: [birth year], 78 y.o.   MRN: [id number] Visit Date: 01/26/2019  Today's Provider: Lavon Paganini, MD   Chief Complaint  Patient presents with  . Annual Exam   Subjective:     Complete Physical Dontell Mian is a 78 y.o. male. He feels fairly well. Pt reports having trouble with shortness of breath.  He is getting a chest CT later this month.   He reports exercising very little. He reports he is sleeping fairly well.  ----------------------------------------------------------- He is planning for colonoscopy in 07/2019 after he returns from [place].  He will return to [place] for the winter.  Review of Systems  Constitutional: Negative.   HENT: Negative.   Eyes: Negative.   Respiratory: Positive for shortness of breath and wheezing. Negative for apnea, cough, choking, chest tightness and stridor.   Cardiovascular: Negative.   Gastrointestinal: Negative.   Endocrine: Negative.   Genitourinary: Negative.   Musculoskeletal: Negative.   Skin: Negative.   Allergic/Immunologic: Negative.   Neurological: Negative.   Hematological: Negative for adenopathy. Bruises/bleeds easily.  Psychiatric/Behavioral: Negative.     Social History   Socioeconomic History  . Marital status: Married    Spouse name: Not on file  . Number of children: 3  . Years of education: Not on file  . Highest education level: Master's degree (e.g., MA, MS, MEng, MEd, MSW, MBA)  Occupational History  . Occupation: retired    Comment: owned [employer]  Social Needs  . Financial resource strain: Not hard at all  . Food insecurity    Worry: Never true    Inability: Never true  . Transportation needs    Medical: No    Non-medical: No  Tobacco Use  . Smoking status: Former Smoker    Packs/day: 1.00    Years: 40.00    Pack years: 40.00    Types: Cigarettes    Quit date: 08/18/2011    Years since quitting: 7.4  . Smokeless tobacco: Never Used   Substance and Sexual Activity  . Alcohol use: Yes    Alcohol/week: 21.0 standard drinks    Types: 21 Shots of liquor per week    Comment: 3 whiskey drinks a day  . Drug use: Never  . Sexual activity: Yes    Partners: Female    Birth control/protection: None  Lifestyle  . Physical activity    Days per week: 0 days    Minutes per session: 0 min  . Stress: Not at all  Relationships  . Social Herbalist on phone: Patient refused    Gets together: Patient refused    Attends religious service: Patient refused    Active member of club or organization: Patient refused    Attends meetings of clubs or organizations: Patient refused    Relationship status: Patient refused  . Intimate partner violence    Fear of current or ex partner: Patient refused    Emotionally abused: Patient refused    Physically abused: Patient refused    Forced sexual activity: Patient refused  Other Topics Concern  . Not on file  Social History Narrative  . Not on file    Past Medical History:  Diagnosis Date  . Allergic rhinitis   . Hyperlipidemia   . Hypertension      Patient Active Problem List   Diagnosis Date Noted  . Hypertension 09/20/2018  . Adjustment disorder 09/20/2018  . BPH associated  with nocturia 09/20/2018  . Atherosclerosis 09/20/2018  . Seasonal allergic rhinitis due to pollen 09/20/2018    Past Surgical History:  Procedure Laterality Date  . CARPAL TUNNEL RELEASE Left   . CATARACT EXTRACTION    . TONSILECTOMY, ADENOIDECTOMY, BILATERAL MYRINGOTOMY AND TUBES      His family history includes Brain cancer in his paternal grandfather; Breast cancer in his sister; COPD in his father; Dementia in his mother; Pancreatic cancer in his father; Pancreatic disease in his father. There is no history of Prostate cancer, Bladder Cancer, or Kidney cancer.   Current Outpatient Medications:  .  atorvastatin (LIPITOR) 40 MG tablet, Take 1 tablet (40 mg total) by mouth daily at 6  PM., Disp: 90 tablet, Rfl: 3 .  citalopram (CELEXA) 20 MG tablet, Take 1 tablet (20 mg total) by mouth daily., Disp: 90 tablet, Rfl: 3 .  diltiazem (TIAZAC) 300 MG 24 hr capsule, Take 1 capsule (300 mg total) by mouth daily., Disp: 90 capsule, Rfl: 3 .  hydrochlorothiazide (HYDRODIURIL) 25 MG tablet, Take 1 tablet (25 mg total) by mouth daily., Disp: 90 tablet, Rfl: 3 .  losartan (COZAAR) 100 MG tablet, Take 1 tablet (100 mg total) by mouth daily., Disp: 90 tablet, Rfl: 3 .  tamsulosin (FLOMAX) 0.4 MG CAPS capsule, Take 1 capsule (0.4 mg total) by mouth daily., Disp: 30 capsule, Rfl: 3  Patient Care Team: Erasmo DownerBacigalupo, Jaqualyn Juday M, MD as PCP - General (Family Medicine) Deirdre EvenerKowalski, David C, MD (Dermatology)     Objective:    Vitals: BP 121/77 (BP Location: Right Arm, Patient Position: Sitting, Cuff Size: Large)   Pulse 86   Temp (!) 96.8 F (36 C) (Temporal)   Wt 234 lb (106.1 kg)   BMI 31.74 kg/m   Physical Exam Vitals signs reviewed.  Constitutional:      General: He is not in acute distress.    Appearance: Normal appearance. He is well-developed. He is not diaphoretic.  HENT:     Head: Normocephalic and atraumatic.     Right Ear: Tympanic membrane, ear canal and external ear normal.     Left Ear: Tympanic membrane, ear canal and external ear normal.  Eyes:     General: No scleral icterus.    Conjunctiva/sclera: Conjunctivae normal.     Pupils: Pupils are equal, round, and reactive to light.  Neck:     Musculoskeletal: Neck supple.     Thyroid: No thyromegaly.  Cardiovascular:     Rate and Rhythm: Normal rate and regular rhythm.     Pulses: Normal pulses.     Heart sounds: Normal heart sounds. No murmur.  Pulmonary:     Effort: Pulmonary effort is normal. No respiratory distress.     Breath sounds: Normal breath sounds. No wheezing or rales.  Abdominal:     General: There is no distension.     Palpations: Abdomen is soft.     Tenderness: There is no abdominal tenderness.   Musculoskeletal:        General: No deformity.     Right lower leg: No edema.     Left lower leg: No edema.  Lymphadenopathy:     Cervical: No cervical adenopathy.  Skin:    General: Skin is warm and dry.     Capillary Refill: Capillary refill takes less than 2 seconds.     Findings: No rash.  Neurological:     Mental Status: He is alert and oriented to person, place, and time. Mental status is at  baseline.  Psychiatric:        Mood and Affect: Mood normal.        Behavior: Behavior normal.        Thought Content: Thought content normal.     Activities of Daily Living In your present state of health, do you have any difficulty performing the following activities: 01/25/2019 09/20/2018  Hearing? N Y  Vision? N N  Difficulty concentrating or making decisions? N N  Walking or climbing stairs? Y Y  Comment Due to SOB. -  Dressing or bathing? N N  Doing errands, shopping? N N  Preparing Food and eating ? N -  Using the Toilet? N -  In the past six months, have you accidently leaked urine? N -  Do you have problems with loss of bowel control? N -  Managing your Medications? N -  Managing your Finances? N -  Housekeeping or managing your Housekeeping? N -  Some recent data might be hidden    Fall Risk Assessment Fall Risk  01/25/2019 09/20/2018  Falls in the past year? 0 0  Number falls in past yr: 0 0  Injury with Fall? 0 -     Depression Screen PHQ 2/9 Scores 01/25/2019 09/20/2018  PHQ - 2 Score 0 0  PHQ- 9 Score - 0    6CIT Screen 01/25/2019  What Year? 0 points  What month? 0 points  What time? 0 points  Count back from 20 0 points  Months in reverse 0 points  Repeat phrase 0 points  Total Score 0       Assessment & Plan:    Annual Physical Reviewed patient's Family Medical History Reviewed and updated list of patient's medical providers Assessment of cognitive impairment was done Assessed patient's functional ability Established a written schedule for  health screening services Health Risk Assessent Completed and Reviewed  Exercise Activities and Dietary recommendations Goals    . DIET - INCREASE WATER INTAKE     Recommend to drink at least 6-8 8oz glasses of water per day.       Immunization History  Administered Date(s) Administered  . Hepatitis A, Adult 01/13/2011, 07/10/2011  . Pneumococcal Conjugate-13 10/20/2013  . Pneumococcal Polysaccharide-23 02/08/2009, 11/22/2014, 01/10/2018  . Tdap 01/03/2016  . Zoster 08/12/2008  . Zoster Recombinat (Shingrix) 03/19/2017, 08/20/2017    Health Maintenance  Topic Date Due  . COLONOSCOPY  02/28/1993  . INFLUENZA VACCINE  10/09/2018  . TETANUS/TDAP  01/02/2026  . PNA vac Low Risk Adult  Completed     Discussed health benefits of physical activity, and encouraged him to engage in regular exercise appropriate for his age and condition.    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   The entirety of the information documented in the History of  Present Illness, Review of Systems and Physical Exam were personally obtained by me. Portions of this information were initially documented by Kavin Leech, CMA and reviewed by me for thoroughness and accuracy.    Ura Hausen, Marzella Schlein, MD MPH [facility]

## 2019-01-26 NOTE — Patient Instructions (Signed)
Preventive Care 75 Years and Older, Male Preventive care refers to lifestyle choices and visits with your health care provider that can promote health and wellness. This includes:  A yearly physical exam. This is also called an annual well check.  Regular dental and eye exams.  Immunizations.  Screening for certain conditions.  Healthy lifestyle choices, such as diet and exercise. What can I expect for my preventive care visit? Physical exam Your health care provider will check:  Height and weight. These may be used to calculate body mass index (BMI), which is a measurement that tells if you are at a healthy weight.  Heart rate and blood pressure.  Your skin for abnormal spots. Counseling Your health care provider may ask you questions about:  Alcohol, tobacco, and drug use.  Emotional well-being.  Home and relationship well-being.  Sexual activity.  Eating habits.  History of falls.  Memory and ability to understand (cognition).  Work and work Statistician. What immunizations do I need?  Influenza (flu) vaccine  This is recommended every year. Tetanus, diphtheria, and pertussis (Tdap) vaccine  You may need a Td booster every 10 years. Varicella (chickenpox) vaccine  You may need this vaccine if you have not already been vaccinated. Zoster (shingles) vaccine  You may need this after age 50. Pneumococcal conjugate (PCV13) vaccine  One dose is recommended after age 24. Pneumococcal polysaccharide (PPSV23) vaccine  One dose is recommended after age 33. Measles, mumps, and rubella (MMR) vaccine  You may need at least one dose of MMR if you were born in 1957 or later. You may also need a second dose. Meningococcal conjugate (MenACWY) vaccine  You may need this if you have certain conditions. Hepatitis A vaccine  You may need this if you have certain conditions or if you travel or work in places where you may be exposed to hepatitis A. Hepatitis B vaccine   You may need this if you have certain conditions or if you travel or work in places where you may be exposed to hepatitis B. Haemophilus influenzae type b (Hib) vaccine  You may need this if you have certain conditions. You may receive vaccines as individual doses or as more than one vaccine together in one shot (combination vaccines). Talk with your health care provider about the risks and benefits of combination vaccines. What tests do I need? Blood tests  Lipid and cholesterol levels. These may be checked every 5 years, or more frequently depending on your overall health.  Hepatitis C test.  Hepatitis B test. Screening  Lung cancer screening. You may have this screening every year starting at age 74 if you have a 30-pack-year history of smoking and currently smoke or have quit within the past 15 years.  Colorectal cancer screening. All adults should have this screening starting at age 57 and continuing until age 54. Your health care provider may recommend screening at age 47 if you are at increased risk. You will have tests every 1-10 years, depending on your results and the type of screening test.  Prostate cancer screening. Recommendations will vary depending on your family history and other risks.  Diabetes screening. This is done by checking your blood sugar (glucose) after you have not eaten for a while (fasting). You may have this done every 1-3 years.  Abdominal aortic aneurysm (AAA) screening. You may need this if you are a current or former smoker.  Sexually transmitted disease (STD) testing. Follow these instructions at home: Eating and drinking  Eat  a diet that includes fresh fruits and vegetables, whole grains, lean protein, and low-fat dairy products. Limit your intake of foods with high amounts of sugar, saturated fats, and salt.  Take vitamin and mineral supplements as recommended by your health care provider.  Do not drink alcohol if your health care provider  tells you not to drink.  If you drink alcohol: ? Limit how much you have to 0-2 drinks a day. ? Be aware of how much alcohol is in your drink. In the [place] one drink equals one 12 oz bottle of beer (355 mL), one 5 oz glass of wine (148 mL), or one 1 oz glass of hard liquor (44 mL). Lifestyle  Take daily care of your teeth and gums.  Stay active. Exercise for at least 30 minutes on 5 or more days each week.  Do not use any products that contain nicotine or tobacco, such as cigarettes, e-cigarettes, and chewing tobacco. If you need help quitting, ask your health care provider.  If you are sexually active, practice safe sex. Use a condom or other form of protection to prevent STIs (sexually transmitted infections).  Talk with your health care provider about taking a low-dose aspirin or statin. What's next?  Visit your health care provider once a year for a well check visit.  Ask your health care provider how often you should have your eyes and teeth checked.  Stay up to date on all vaccines. This information is not intended to replace advice given to you by your health care provider. Make sure you discuss any questions you have with your health care provider. Document Released: 03/23/2015 Document Revised: 02/18/2018 Document Reviewed: 02/18/2018 Elsevier Patient Education  2020 Elsevier Inc.  

## 2019-01-26 NOTE — Assessment & Plan Note (Signed)
Discussed importance of healthy weight management Discussed diet and exercise  

## 2019-01-26 NOTE — Telephone Encounter (Signed)
Received referral for initial lung cancer screening scan. Contacted patient and obtained smoking history,(former, quit 08/18/11, 40 pack year) as well as answering questions related to screening process. Patient denies signs of lung cancer such as weight loss or hemoptysis. Patient denies comorbidity that would prevent curative treatment if lung cancer were found. Patient is scheduled for shared decision making visit and CT scan on 02/08/19 at 145pm.

## 2019-01-27 DIAGNOSIS — I10 Essential (primary) hypertension: Secondary | ICD-10-CM | POA: Diagnosis not present

## 2019-01-27 DIAGNOSIS — Z6831 Body mass index (BMI) 31.0-31.9, adult: Secondary | ICD-10-CM | POA: Diagnosis not present

## 2019-01-27 DIAGNOSIS — Z0000 Encounter for general adult medical examination without abnormal findings: Secondary | ICD-10-CM | POA: Diagnosis not present

## 2019-01-27 DIAGNOSIS — E669 Obesity, unspecified: Secondary | ICD-10-CM | POA: Diagnosis not present

## 2019-01-28 ENCOUNTER — Telehealth: Payer: Self-pay

## 2019-01-28 LAB — CBC
Hematocrit: 37.3 % — ABNORMAL LOW (ref 37.5–51.0)
Hemoglobin: 13 g/dL (ref 13.0–17.7)
MCH: 33.2 pg — ABNORMAL HIGH (ref 26.6–33.0)
MCHC: 34.9 g/dL (ref 31.5–35.7)
MCV: 95 fL (ref 79–97)
Platelets: 289 10*3/uL (ref 150–450)
RBC: 3.91 x10E6/uL — ABNORMAL LOW (ref 4.14–5.80)
RDW: 11.8 % (ref 11.6–15.4)
WBC: 6.5 10*3/uL (ref 3.4–10.8)

## 2019-01-28 LAB — COMPREHENSIVE METABOLIC PANEL
ALT: 22 IU/L (ref 0–44)
AST: 24 IU/L (ref 0–40)
Albumin/Globulin Ratio: 1.9 (ref 1.2–2.2)
Albumin: 4.5 g/dL (ref 3.7–4.7)
Alkaline Phosphatase: 100 IU/L (ref 39–117)
BUN/Creatinine Ratio: 18 (ref 10–24)
BUN: 25 mg/dL (ref 8–27)
Bilirubin Total: 0.5 mg/dL (ref 0.0–1.2)
CO2: 22 mmol/L (ref 20–29)
Calcium: 9.7 mg/dL (ref 8.6–10.2)
Chloride: 98 mmol/L (ref 96–106)
Creatinine, Ser: 1.39 mg/dL — ABNORMAL HIGH (ref 0.76–1.27)
GFR calc Af Amer: 57 mL/min/{1.73_m2} — ABNORMAL LOW (ref >59)
GFR calc non Af Amer: 49 mL/min/{1.73_m2} — ABNORMAL LOW (ref >59)
Globulin, Total: 2.4 g/dL (ref 1.5–4.5)
Glucose: 98 mg/dL (ref 65–99)
Potassium: 4.2 mmol/L (ref 3.5–5.2)
Sodium: 136 mmol/L (ref 134–144)
Total Protein: 6.9 g/dL (ref 6.0–8.5)

## 2019-01-28 LAB — LIPID PANEL
Chol/HDL Ratio: 2.6 ratio (ref 0.0–5.0)
Cholesterol, Total: 134 mg/dL (ref 100–199)
HDL: 52 mg/dL (ref >39)
LDL Chol Calc (NIH): 62 mg/dL (ref 0–99)
Triglycerides: 108 mg/dL (ref 0–149)
VLDL Cholesterol Cal: 20 mg/dL (ref 5–40)

## 2019-01-28 NOTE — Telephone Encounter (Signed)
-----   Message from Virginia Crews, MD sent at 01/28/2019  1:04 PM EST ----- Normal/stable  labs

## 2019-01-28 NOTE — Telephone Encounter (Signed)
Patient advised as below.  

## 2019-02-08 ENCOUNTER — Inpatient Hospital Stay: Payer: Medicare Other | Attending: Oncology | Admitting: Oncology

## 2019-02-08 ENCOUNTER — Ambulatory Visit
Admission: RE | Admit: 2019-02-08 | Discharge: 2019-02-08 | Disposition: A | Discharge status: Home / Self Care | Payer: Medicare Other | Source: Ambulatory Visit | Attending: Oncology | Admitting: Oncology

## 2019-02-08 ENCOUNTER — Other Ambulatory Visit: Payer: Self-pay

## 2019-02-08 DIAGNOSIS — Z87891 Personal history of nicotine dependence: Secondary | ICD-10-CM

## 2019-02-08 DIAGNOSIS — Z122 Encounter for screening for malignant neoplasm of respiratory organs: Secondary | ICD-10-CM | POA: Diagnosis not present

## 2019-02-08 NOTE — Progress Notes (Signed)
Virtual Visit via Video Note  I connected with Isaac Watkins on 02/08/19 at  1:45 PM EST by a video enabled telemedicine application and verified that I am speaking with the correct person using two identifiers.  Location: Patient: [facility] Provider: Home   I discussed the limitations of evaluation and management by telemedicine and the availability of in person appointments. The patient expressed understanding and agreed to proceed.  I discussed the assessment and treatment plan with the patient. The patient was provided an opportunity to ask questions and all were answered. The patient agreed with the plan and demonstrated an understanding of the instructions.   The patient was advised to call back or seek an in-person evaluation if the symptoms worsen or if the condition fails to improve as anticipated.   In accordance with CMS guidelines, patient has met eligibility criteria including age, absence of signs or symptoms of lung cancer.  Social History   Tobacco Use  . Smoking status: Former Smoker    Packs/day: 1.00    Years: 40.00    Pack years: 40.00    Types: Cigarettes    Quit date: 08/18/2011    Years since quitting: 7.4  . Smokeless tobacco: Never Used  Substance Use Topics  . Alcohol use: Yes    Alcohol/week: 21.0 standard drinks    Types: 21 Shots of liquor per week    Comment: 3 whiskey drinks a day  . Drug use: Never      A shared decision-making session was conducted prior to the performance of CT scan. This includes one or more decision aids, includes benefits and harms of screening, follow-up diagnostic testing, over-diagnosis, false positive rate, and total radiation exposure.   Counseling on the importance of adherence to annual lung cancer LDCT screening, impact of co-morbidities, and ability or willingness to undergo diagnosis and treatment is imperative for compliance of the program.   Counseling on the importance of continued smoking cessation for former smokers;  the importance of smoking cessation for current smokers, and information about tobacco cessation interventions have been given to patient including [place] and [facility] quit [place] programs.   Written order for lung cancer screening with LDCT has been given to the patient and any and all questions have been answered to the best of my abilities.    Yearly follow up will be coordinated by Burgess Estelle, Thoracic Navigator.  I provided 15 minutes of face-to-face video visit time during this encounter, and > 50% was spent counseling as documented under my assessment & plan.   Jacquelin Hawking, NP

## 2019-02-10 ENCOUNTER — Telehealth: Payer: Self-pay

## 2019-02-10 ENCOUNTER — Encounter: Payer: Self-pay | Admitting: *Deleted

## 2019-02-10 NOTE — Telephone Encounter (Signed)
The only reason that I can think of is if it had not been 1 yr since his last CPE/AWV.  We can look out for the appeal form.

## 2019-02-10 NOTE — Telephone Encounter (Signed)
Copied from [place] [id number]. Topic: General - Inquiry >> Feb 10, 2019 10:59 AM Scherrie Gerlach wrote: Reason for CRM: pt states his bcbs denied both visits, the [place] and the cpe w/ Dr B. He received an appeal form, and we will get as well. Please let him know if he needs to sign anything. DOS 01/25/2019 and 01/26/2019

## 2019-03-14 ENCOUNTER — Ambulatory Visit (INDEPENDENT_AMBULATORY_CARE_PROVIDER_SITE_OTHER): Payer: Medicare Other | Admitting: Family Medicine

## 2019-03-14 ENCOUNTER — Other Ambulatory Visit: Payer: Self-pay

## 2019-03-14 ENCOUNTER — Encounter: Payer: Self-pay | Admitting: Family Medicine

## 2019-03-14 VITALS — BP 133/87 | HR 80 | Temp 97.1°F | Resp 16 | Ht 72.0 in | Wt 232.0 lb

## 2019-03-14 DIAGNOSIS — M25512 Pain in left shoulder: Secondary | ICD-10-CM

## 2019-03-14 DIAGNOSIS — W19XXXA Unspecified fall, initial encounter: Secondary | ICD-10-CM

## 2019-03-14 DIAGNOSIS — I10 Essential (primary) hypertension: Secondary | ICD-10-CM | POA: Diagnosis not present

## 2019-03-14 DIAGNOSIS — E669 Obesity, unspecified: Secondary | ICD-10-CM

## 2019-03-14 DIAGNOSIS — Z6831 Body mass index (BMI) 31.0-31.9, adult: Secondary | ICD-10-CM

## 2019-03-14 NOTE — Assessment & Plan Note (Signed)
Discussed importance of healthy weight management Discussed diet and exercise  

## 2019-03-14 NOTE — Patient Instructions (Signed)
Shoulder Exercises Ask your health care provider which exercises are safe for you. Do exercises exactly as told by your health care provider and adjust them as directed. It is normal to feel mild stretching, pulling, tightness, or discomfort as you do these exercises. Stop right away if you feel sudden pain or your pain gets worse. Do not begin these exercises until told by your health care provider. Stretching exercises External rotation and abduction This exercise is sometimes called corner stretch. This exercise rotates your arm outward (external rotation) and moves your arm out from your body (abduction). 1. Stand in a doorway with one of your feet slightly in front of the other. This is called a staggered stance. If you cannot reach your forearms to the door frame, stand facing a corner of a room. 2. Choose one of the following positions as told by your health care provider: ? Place your hands and forearms on the door frame above your head. ? Place your hands and forearms on the door frame at the height of your head. ? Place your hands on the door frame at the height of your elbows. 3. Slowly move your weight onto your front foot until you feel a stretch across your chest and in the front of your shoulders. Keep your head and chest upright and keep your abdominal muscles tight. 4. Hold for __________ seconds. 5. To release the stretch, shift your weight to your back foot. Repeat __________ times. Complete this exercise __________ times a day. Extension, standing 1. Stand and hold a broomstick, a cane, or a similar object behind your back. ? Your hands should be a little wider than shoulder width apart. ? Your palms should face away from your back. 2. Keeping your elbows straight and your shoulder muscles relaxed, move the stick away from your body until you feel a stretch in your shoulders (extension). ? Avoid shrugging your shoulders while you move the stick. Keep your shoulder blades tucked  down toward the middle of your back. 3. Hold for __________ seconds. 4. Slowly return to the starting position. Repeat __________ times. Complete this exercise __________ times a day. Range-of-motion exercises Pendulum  1. Stand near a wall or a surface that you can hold onto for balance. 2. Bend at the waist and let your left / right arm hang straight down. Use your other arm to support you. Keep your back straight and do not lock your knees. 3. Relax your left / right arm and shoulder muscles, and move your hips and your trunk so your left / right arm swings freely. Your arm should swing because of the motion of your body, not because you are using your arm or shoulder muscles. 4. Keep moving your hips and trunk so your arm swings in the following directions, as told by your health care provider: ? Side to side. ? Forward and backward. ? In clockwise and counterclockwise circles. 5. Continue each motion for __________ seconds, or for as long as told by your health care provider. 6. Slowly return to the starting position. Repeat __________ times. Complete this exercise __________ times a day. Shoulder flexion, standing  1. Stand and hold a broomstick, a cane, or a similar object. Place your hands a little more than shoulder width apart on the object. Your left / right hand should be palm up, and your other hand should be palm down. 2. Keep your elbow straight and your shoulder muscles relaxed. Push the stick up with your healthy arm to   raise your left / right arm in front of your body, and then over your head until you feel a stretch in your shoulder (flexion). ? Avoid shrugging your shoulder while you raise your arm. Keep your shoulder blade tucked down toward the middle of your back. 3. Hold for __________ seconds. 4. Slowly return to the starting position. Repeat __________ times. Complete this exercise __________ times a day. Shoulder abduction, standing 1. Stand and hold a broomstick,  a cane, or a similar object. Place your hands a little more than shoulder width apart on the object. Your left / right hand should be palm up, and your other hand should be palm down. 2. Keep your elbow straight and your shoulder muscles relaxed. Push the object across your body toward your left / right side. Raise your left / right arm to the side of your body (abduction) until you feel a stretch in your shoulder. ? Do not raise your arm above shoulder height unless your health care provider tells you to do that. ? If directed, raise your arm over your head. ? Avoid shrugging your shoulder while you raise your arm. Keep your shoulder blade tucked down toward the middle of your back. 3. Hold for __________ seconds. 4. Slowly return to the starting position. Repeat __________ times. Complete this exercise __________ times a day. Internal rotation  1. Place your left / right hand behind your back, palm up. 2. Use your other hand to dangle an exercise band, a towel, or a similar object over your shoulder. Grasp the band with your left / right hand so you are holding on to both ends. 3. Gently pull up on the band until you feel a stretch in the front of your left / right shoulder. The movement of your arm toward the center of your body is called internal rotation. ? Avoid shrugging your shoulder while you raise your arm. Keep your shoulder blade tucked down toward the middle of your back. 4. Hold for __________ seconds. 5. Release the stretch by letting go of the band and lowering your hands. Repeat __________ times. Complete this exercise __________ times a day. Strengthening exercises External rotation  1. Sit in a stable chair without armrests. 2. Secure an exercise band to a stable object at elbow height on your left / right side. 3. Place a soft object, such as a folded towel or a small pillow, between your left / right upper arm and your body to move your elbow about 4 inches (10 cm) away  from your side. 4. Hold the end of the exercise band so it is tight and there is no slack. 5. Keeping your elbow pressed against the soft object, slowly move your forearm out, away from your abdomen (external rotation). Keep your body steady so only your forearm moves. 6. Hold for __________ seconds. 7. Slowly return to the starting position. Repeat __________ times. Complete this exercise __________ times a day. Shoulder abduction  1. Sit in a stable chair without armrests, or stand up. 2. Hold a __________ weight in your left / right hand, or hold an exercise band with both hands. 3. Start with your arms straight down and your left / right palm facing in, toward your body. 4. Slowly lift your left / right hand out to your side (abduction). Do not lift your hand above shoulder height unless your health care provider tells you that this is safe. ? Keep your arms straight. ? Avoid shrugging your shoulder while you   do this movement. Keep your shoulder blade tucked down toward the middle of your back. 5. Hold for __________ seconds. 6. Slowly lower your arm, and return to the starting position. Repeat __________ times. Complete this exercise __________ times a day. Shoulder extension 1. Sit in a stable chair without armrests, or stand up. 2. Secure an exercise band to a stable object in front of you so it is at shoulder height. 3. Hold one end of the exercise band in each hand. Your palms should face each other. 4. Straighten your elbows and lift your hands up to shoulder height. 5. Step back, away from the secured end of the exercise band, until the band is tight and there is no slack. 6. Squeeze your shoulder blades together as you pull your hands down to the sides of your thighs (extension). Stop when your hands are straight down by your sides. Do not let your hands go behind your body. 7. Hold for __________ seconds. 8. Slowly return to the starting position. Repeat __________ times.  Complete this exercise __________ times a day. Shoulder row 1. Sit in a stable chair without armrests, or stand up. 2. Secure an exercise band to a stable object in front of you so it is at waist height. 3. Hold one end of the exercise band in each hand. Position your palms so that your thumbs are facing the ceiling (neutral position). 4. Bend each of your elbows to a 90-degree angle (right angle) and keep your upper arms at your sides. 5. Step back until the band is tight and there is no slack. 6. Slowly pull your elbows back behind you. 7. Hold for __________ seconds. 8. Slowly return to the starting position. Repeat __________ times. Complete this exercise __________ times a day. Shoulder press-ups  1. Sit in a stable chair that has armrests. Sit upright, with your feet flat on the floor. 2. Put your hands on the armrests so your elbows are bent and your fingers are pointing forward. Your hands should be about even with the sides of your body. 3. Push down on the armrests and use your arms to lift yourself off the chair. Straighten your elbows and lift yourself up as much as you comfortably can. ? Move your shoulder blades down, and avoid letting your shoulders move up toward your ears. ? Keep your feet on the ground. As you get stronger, your feet should support less of your body weight as you lift yourself up. 4. Hold for __________ seconds. 5. Slowly lower yourself back into the chair. Repeat __________ times. Complete this exercise __________ times a day. Wall push-ups  1. Stand so you are facing a stable wall. Your feet should be about one arm-length away from the wall. 2. Lean forward and place your palms on the wall at shoulder height. 3. Keep your feet flat on the floor as you bend your elbows and lean forward toward the wall. 4. Hold for __________ seconds. 5. Straighten your elbows to push yourself back to the starting position. Repeat __________ times. Complete this exercise  __________ times a day. This information is not intended to replace advice given to you by your health care provider. Make sure you discuss any questions you have with your health care provider. Document Revised: 06/18/2018 Document Reviewed: 03/26/2018 Elsevier Patient Education  2020 [employer]  

## 2019-03-14 NOTE — Progress Notes (Signed)
Patient: Eschol Auxier Male    DOB: 29-Jan-1941   79 y.o.   MRN: [id number] Visit Date: 03/14/2019  Today's Provider: Shirlee Latch, MD   Chief Complaint  Patient presents with  . Hypertension  . Shoulder Pain   Subjective:     HPI  Hypertension, follow-up:  BP Readings from Last 3 Encounters:  03/14/19 133/87  01/26/19 121/77  09/20/18 126/78    He was last seen for hypertension 6 months ago.  BP at that visit was 126/78. Management changes since that visit include no changes. He reports excellent compliance with treatment. He is not having side effects.  He is exercising. He is not adherent to low salt diet.   Outside blood pressures are stable. He is experiencing none.  Patient denies chest pain and lower extremity edema.   Cardiovascular risk factors include advanced age (older than 20 for men, 74 for women) and hypertension.  Use of agents associated with hypertension: none.     Weight trend: stable Wt Readings from Last 3 Encounters:  03/14/19 232 lb (105.2 kg)  02/08/19 235 lb (106.6 kg)  01/26/19 234 lb (106.1 kg)    Current diet: not asked  ------------------------------------------------------------------------ Patient C/O left shoulder pain due to a fall a week ago. Patient reports pain with activity.   Pain is in L shoulder.  Tripped over something and fell on the side.  Pain all over, but no loss of motion.  No weakness or numbness. No swelling or bruising.  Tried some ibuprofen with some relief. Somewhat better than it was last week.  Wants to be sure that he did not tear anything.  No Known Allergies   Current Outpatient Medications:  .  atorvastatin (LIPITOR) 40 MG tablet, Take 1 tablet (40 mg total) by mouth daily at 6 PM., Disp: 90 tablet, Rfl: 3 .  citalopram (CELEXA) 20 MG tablet, Take 1 tablet (20 mg total) by mouth daily., Disp: 90 tablet, Rfl: 3 .  diltiazem (TIAZAC) 300 MG 24 hr capsule, Take 1 capsule (300 mg total) by mouth  daily., Disp: 90 capsule, Rfl: 3 .  hydrochlorothiazide (HYDRODIURIL) 25 MG tablet, Take 1 tablet (25 mg total) by mouth daily., Disp: 90 tablet, Rfl: 3 .  losartan (COZAAR) 100 MG tablet, Take 1 tablet (100 mg total) by mouth daily., Disp: 90 tablet, Rfl: 3  Review of Systems  Constitutional: Negative.   Respiratory: Negative.   Cardiovascular: Negative.   Musculoskeletal: Positive for myalgias.    Social History   Tobacco Use  . Smoking status: Former Smoker    Packs/day: 1.00    Years: 40.00    Pack years: 40.00    Types: Cigarettes    Quit date: 08/18/2011    Years since quitting: 7.5  . Smokeless tobacco: Never Used  Substance Use Topics  . Alcohol use: Yes    Alcohol/week: 21.0 standard drinks    Types: 21 Shots of liquor per week    Comment: 3 whiskey drinks a day      Objective:   BP 133/87 (BP Location: Left Arm, Patient Position: Sitting, Cuff Size: Large)   Pulse 80   Temp (!) 97.1 F (36.2 C) (Temporal)   Resp 16   Ht 6' (1.829 m)   Wt 232 lb (105.2 kg)   BMI 31.46 kg/m  Vitals:   03/14/19 0814  BP: 133/87  Pulse: 80  Resp: 16  Temp: (!) 97.1 F (36.2 C)  TempSrc: Temporal  Weight:  232 lb (105.2 kg)  Height: 6' (1.829 m)  Body mass index is 31.46 kg/m.   Physical Exam Vitals reviewed.  Constitutional:      General: He is not in acute distress.    Appearance: Normal appearance. He is not diaphoretic.  HENT:     Head: Normocephalic and atraumatic.  Eyes:     General: No scleral icterus.    Conjunctiva/sclera: Conjunctivae normal.  Cardiovascular:     Rate and Rhythm: Normal rate and regular rhythm.     Pulses: Normal pulses.     Heart sounds: Normal heart sounds. No murmur.  Pulmonary:     Effort: Pulmonary effort is normal. No respiratory distress.     Breath sounds: Normal breath sounds. No wheezing or rhonchi.  Abdominal:     General: There is no distension.     Palpations: Abdomen is soft.     Tenderness: There is no abdominal  tenderness.  Musculoskeletal:     Cervical back: Neck supple.     Right lower leg: No edema.     Left lower leg: No edema.     Comments: L Shoulder: Inspection reveals no atrophy or asymmetry.  Mild swelling over L deltoid Palpation is normal with no tenderness over AC joint or bicipital groove. ROM is full in all planes. Rotator cuff strength normal throughout, but does have pain with resisted external rotation Mild pain with Hawkins test.  Lymphadenopathy:     Cervical: No cervical adenopathy.  Skin:    General: Skin is warm and dry.     Capillary Refill: Capillary refill takes less than 2 seconds.     Findings: No rash.  Neurological:     Mental Status: He is alert and oriented to person, place, and time.     Cranial Nerves: No cranial nerve deficit.  Psychiatric:        Mood and Affect: Mood normal.        Behavior: Behavior normal.      No results found for any visits on 03/14/19.     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   The entirety of the information documented in the History of Present Illness, Review of Systems and Physical Exam were  personally obtained by me. Portions of this information were initially documented by Lynford Humphrey, CMA and reviewed by me for thoroughness and accuracy.    Trayvond Viets, Dionne Bucy, MD MPH [facility]

## 2019-03-14 NOTE — Assessment & Plan Note (Signed)
Well controlled Continue current medications Reviewed recent metabolic panel F/u in 6 months  

## 2019-03-28 ENCOUNTER — Ambulatory Visit: Payer: Medicare Other

## 2019-05-05 ENCOUNTER — Telehealth: Payer: Self-pay

## 2019-05-05 DIAGNOSIS — M25512 Pain in left shoulder: Secondary | ICD-10-CM

## 2019-05-05 NOTE — Telephone Encounter (Signed)
Pt's wife [name] sent a My Chart message below for Isaac Watkins.  Dr. Beryle Flock is needing clarification.  Does Isaac Watkins need a referral to Ortho?  I left a message for her to call back.  PEC please advise.   Thanks,   -Vernona Rieger

## 2019-05-05 NOTE — Telephone Encounter (Signed)
Fabienne Bruns to Erasmo Downer, MD     4:15 PM I need authorization from Dr. Albin Fischer for Isaac Watkins to see Dr. Maisie Fus Parent in [place], [place] for an X-ray of his shoulder.  Dr. Leveda Anna tax ID number is [id number].  His NPI number is [id number].  The fax number is [phone].  Isaac Watkins's birthday is [birth year].Thank you.

## 2019-05-06 NOTE — Telephone Encounter (Signed)
Patient's wife is returning a call to Vernona Rieger regarding a referral for a doctor for patient.  Please call back at [phone]

## 2019-05-06 NOTE — Telephone Encounter (Signed)
Call to patient's wife cell number- unable to leave message on patient cell number. Advised PCP needs more information for referral to outside provider. Left message to call office number- may speak to any nurse available.

## 2019-05-09 NOTE — Telephone Encounter (Signed)
I spoke with Mrs. Hiney.  She states that Mr. Bluett needs a referral to Dr. Stefan Church.  He is an orthopedic in [place] (They are staying there for the winter.    Thanks,   -Vernona Rieger

## 2019-05-10 NOTE — Telephone Encounter (Signed)
Ok to place referral.

## 2019-05-10 NOTE — Telephone Encounter (Signed)
Will fax referral to Dr. Leveda Anna office fax # [phone]

## 2019-05-31 DIAGNOSIS — M25512 Pain in left shoulder: Secondary | ICD-10-CM | POA: Diagnosis not present

## 2019-05-31 DIAGNOSIS — M19012 Primary osteoarthritis, left shoulder: Secondary | ICD-10-CM | POA: Diagnosis not present

## 2019-06-09 DIAGNOSIS — S46012A Strain of muscle(s) and tendon(s) of the rotator cuff of left shoulder, initial encounter: Secondary | ICD-10-CM | POA: Diagnosis not present

## 2019-06-09 DIAGNOSIS — M67814 Other specified disorders of tendon, left shoulder: Secondary | ICD-10-CM | POA: Diagnosis not present

## 2019-06-09 DIAGNOSIS — S46212A Strain of muscle, fascia and tendon of other parts of biceps, left arm, initial encounter: Secondary | ICD-10-CM | POA: Diagnosis not present

## 2019-06-09 DIAGNOSIS — M7552 Bursitis of left shoulder: Secondary | ICD-10-CM | POA: Diagnosis not present

## 2019-06-16 DIAGNOSIS — M25512 Pain in left shoulder: Secondary | ICD-10-CM | POA: Diagnosis not present

## 2019-06-30 ENCOUNTER — Encounter: Payer: Self-pay | Admitting: Family Medicine

## 2019-06-30 ENCOUNTER — Ambulatory Visit (INDEPENDENT_AMBULATORY_CARE_PROVIDER_SITE_OTHER): Payer: Medicare Other | Admitting: Family Medicine

## 2019-06-30 ENCOUNTER — Other Ambulatory Visit: Payer: Self-pay

## 2019-06-30 VITALS — BP 110/75 | HR 79 | Temp 96.9°F | Resp 16 | Wt 234.0 lb

## 2019-06-30 DIAGNOSIS — I10 Essential (primary) hypertension: Secondary | ICD-10-CM

## 2019-06-30 DIAGNOSIS — M75112 Incomplete rotator cuff tear or rupture of left shoulder, not specified as traumatic: Secondary | ICD-10-CM

## 2019-06-30 DIAGNOSIS — M7552 Bursitis of left shoulder: Secondary | ICD-10-CM | POA: Diagnosis not present

## 2019-06-30 NOTE — Assessment & Plan Note (Addendum)
New problem, as above Bursitis and partial left rotator cuff tear/tendinosis Nonsurgical management Was already evaluated by Ortho in [place] Referred to PT Patient advised to use ice  Will call in Meloxicam after CMP report pending creatinine Too soon for repeat corticosteroid injection Consider Ortho referral in the future

## 2019-06-30 NOTE — Progress Notes (Signed)
Established patient visit   Patient: Isaac Watkins   DOB: 11/11/1940   79 y.o. Male  MRN: [id number] Visit Date: 06/30/2019  I,Isaac Watkins,acting as a scribe for Isaac Latch, MD.,have documented all relevant documentation on the behalf of Isaac Latch, MD,as directed by  Isaac Latch, MD while in the presence of Isaac Latch, MD.  Today's healthcare provider: Shirlee Latch, MD   Chief Complaint  Patient presents with  . Shoulder Pain   Subjective    HPI Patient here today C/O persistent and worsening left shoulder pain. Patient reports that while in [place] he saw seen by orthopedic surgeon and was given a cortisone injection ~3 weeks ago. Patient had an x-ray and MRI. Patient reports that after injection his pain did improve, but it only lasted ~10 days. Patient reports taking Ibuprofen PRN. Patient reports that pain is worse when he tries to raise his arm up.   Social History   Tobacco Use  . Smoking status: Former Smoker    Packs/day: 1.00    Years: 40.00    Pack years: 40.00    Types: Cigarettes    Quit date: 08/18/2011    Years since quitting: 7.8  . Smokeless tobacco: Never Used  Substance Use Topics  . Alcohol use: Yes    Alcohol/week: 21.0 standard drinks    Types: 21 Shots of liquor per week    Comment: 3 whiskey drinks a day  . Drug use: Never       Medications: Outpatient Medications Prior to Visit  Medication Sig  . atorvastatin (LIPITOR) 40 MG tablet Take 1 tablet (40 mg total) by mouth daily at 6 PM.  . citalopram (CELEXA) 20 MG tablet Take 1 tablet (20 mg total) by mouth daily.  Marland Kitchen diltiazem (TIAZAC) 300 MG 24 hr capsule Take 1 capsule (300 mg total) by mouth daily.  . hydrochlorothiazide (HYDRODIURIL) 25 MG tablet Take 1 tablet (25 mg total) by mouth daily.  Marland Kitchen losartan (COZAAR) 100 MG tablet Take 1 tablet (100 mg total) by mouth daily.   No facility-administered medications prior to visit.    Review of Systems    Constitutional: Negative for chills and fever.  Respiratory: Negative for chest tightness.   Cardiovascular: Negative for chest pain and palpitations.  Musculoskeletal: Positive for arthralgias and myalgias. Negative for neck pain and neck stiffness.       Objective    BP 110/75 (BP Location: Left Arm, Patient Position: Sitting, Cuff Size: Large)   Pulse 79   Temp (!) 96.9 F (36.1 C) (Temporal)   Resp 16   Wt 234 lb (106.1 kg)   BMI 31.74 kg/m    Physical Exam Vitals reviewed.  Constitutional:      General: He is not in acute distress.    Appearance: Normal appearance. He is not diaphoretic.  HENT:     Head: Normocephalic and atraumatic.  Eyes:     General: No scleral icterus.    Conjunctiva/sclera: Conjunctivae normal.  Cardiovascular:     Rate and Rhythm: Normal rate and regular rhythm.     Pulses: Normal pulses.     Heart sounds: Normal heart sounds. No murmur.  Pulmonary:     Effort: Pulmonary effort is normal. No respiratory distress.     Breath sounds: Normal breath sounds. No wheezing or rhonchi.  Musculoskeletal:     Cervical back: Neck supple.     Right lower leg: No edema.     Left lower leg: No edema.  Comments: L Shoulder: Inspection reveals no atrophy or asymmetry or swelling Palpation reveals mild tenderness to palpation over bicipital groove ROM is full in all planes, but has pain with overhead motions and internal rotation. Rotator cuff strength normal throughout, but does have pain with resisted external rotation Mild pain with Hawkins test.  Lymphadenopathy:     Cervical: No cervical adenopathy.  Skin:    General: Skin is warm and dry.     Capillary Refill: Capillary refill takes less than 2 seconds.     Findings: No rash.  Neurological:     Mental Status: He is alert and oriented to person, place, and time.     Cranial Nerves: No cranial nerve deficit.     Sensory: No sensory deficit.     Motor: No weakness.  Psychiatric:        Mood  and Affect: Mood normal.        Behavior: Behavior normal.      Reviewed done by Dr. Marcello Moores parent at [facility] in [place].  This reveals partial articular surface tear of the supraspinatus, infraspinatus and subscapularis tendinosis, split tear of the biceps tendon, moderate subacromial/subdeltoid bursitis, mild subcoracoid bursitis, small joint effusion  No results found for any visits on 06/30/19.  Assessment & Plan     Problem List Items Addressed This Visit      Cardiovascular and Mediastinum   Hypertension    Well controlled Continue current medications Recheck metabolic panel        Relevant Orders   Basic metabolic panel     Musculoskeletal and Integument   Bursitis of left shoulder    Persistent left shoulder pain despite recent corticosteroid injection He has bursitis and partial rotator cuff tear but not needing surgery at this time per Ortho Refer to physical therapy Patient advised to continue using ice on left shoulder  Check BMP Will send Meloxicam if CMP is normal. Discussed it is too early for him to have a repeat corticosteroid injection Consider Ortho referral in the future       Relevant Orders   Ambulatory referral to Physical Therapy   Nontraumatic incomplete tear of left rotator cuff - Primary    New problem, as above Bursitis and partial left rotator cuff tear/tendinosis Nonsurgical management Was already evaluated by Ortho in [place] Referred to PT Patient advised to use ice  Will call in Meloxicam after CMP report pending creatinine Too soon for repeat corticosteroid injection Consider Ortho referral in the future      Relevant Orders   Ambulatory referral to Physical Therapy       Return for as scheduled.      I, Isaac Paganini, MD, have reviewed all documentation for this visit. The documentation on 06/30/19 for the exam, diagnosis, procedures, and orders are all accurate and complete.   Isaac Watkins, Dionne Bucy, MD, MPH [facility]

## 2019-06-30 NOTE — Assessment & Plan Note (Addendum)
Persistent left shoulder pain despite recent corticosteroid injection He has bursitis and partial rotator cuff tear but not needing surgery at this time per Ortho Refer to physical therapy Patient advised to continue using ice on left shoulder  Check BMP Will send Meloxicam if CMP is normal. Discussed it is too early for him to have a repeat corticosteroid injection Consider Ortho referral in the future

## 2019-06-30 NOTE — Assessment & Plan Note (Signed)
Well controlled Continue current medications Recheck metabolic panel 

## 2019-07-01 ENCOUNTER — Telehealth: Payer: Self-pay

## 2019-07-01 DIAGNOSIS — I10 Essential (primary) hypertension: Secondary | ICD-10-CM

## 2019-07-01 LAB — BASIC METABOLIC PANEL
BUN/Creatinine Ratio: 15 (ref 10–24)
BUN: 33 mg/dL — ABNORMAL HIGH (ref 8–27)
CO2: 24 mmol/L (ref 20–29)
Calcium: 9.7 mg/dL (ref 8.6–10.2)
Chloride: 96 mmol/L (ref 96–106)
Creatinine, Ser: 2.15 mg/dL — ABNORMAL HIGH (ref 0.76–1.27)
GFR calc Af Amer: 33 mL/min/{1.73_m2} — ABNORMAL LOW (ref >59)
GFR calc non Af Amer: 29 mL/min/{1.73_m2} — ABNORMAL LOW (ref >59)
Glucose: 89 mg/dL (ref 65–99)
Potassium: 4.4 mmol/L (ref 3.5–5.2)
Sodium: 138 mmol/L (ref 134–144)

## 2019-07-01 NOTE — Telephone Encounter (Signed)
-----   Message from Erasmo Downer, MD sent at 07/01/2019  8:54 AM EDT ----- Kidney function is worse than it was 5 months ago.  Is he taking NSAIDs?  Needs to hold them.  Cannot use Meloxicam for shoulder pain, only tylenol.  Needs to stay well hydrated. Recheck in 1-2 weeks.  May need to hold HCTZ in the future

## 2019-07-01 NOTE — Telephone Encounter (Signed)
Pt advised.... Labs ordered.   Thanks,   -Cerina Leary  

## 2019-07-27 ENCOUNTER — Ambulatory Visit (INDEPENDENT_AMBULATORY_CARE_PROVIDER_SITE_OTHER): Payer: Medicare Other | Admitting: Family Medicine

## 2019-07-27 ENCOUNTER — Other Ambulatory Visit: Payer: Self-pay

## 2019-07-27 ENCOUNTER — Encounter: Payer: Self-pay | Admitting: Family Medicine

## 2019-07-27 VITALS — BP 128/68 | HR 70 | Temp 97.5°F | Wt 237.0 lb

## 2019-07-27 DIAGNOSIS — E669 Obesity, unspecified: Secondary | ICD-10-CM | POA: Diagnosis not present

## 2019-07-27 DIAGNOSIS — E7800 Pure hypercholesterolemia, unspecified: Secondary | ICD-10-CM | POA: Diagnosis not present

## 2019-07-27 DIAGNOSIS — M7552 Bursitis of left shoulder: Secondary | ICD-10-CM | POA: Diagnosis not present

## 2019-07-27 DIAGNOSIS — J439 Emphysema, unspecified: Secondary | ICD-10-CM | POA: Insufficient documentation

## 2019-07-27 DIAGNOSIS — I10 Essential (primary) hypertension: Secondary | ICD-10-CM | POA: Diagnosis not present

## 2019-07-27 DIAGNOSIS — Z6832 Body mass index (BMI) 32.0-32.9, adult: Secondary | ICD-10-CM

## 2019-07-27 DIAGNOSIS — I700 Atherosclerosis of aorta: Secondary | ICD-10-CM | POA: Insufficient documentation

## 2019-07-27 MED ORDER — LOSARTAN POTASSIUM 100 MG PO TABS: 100.0000 mg | ORAL_TABLET | Freq: Every day | ORAL | 3 refills | Status: DC

## 2019-07-27 MED ORDER — HYDROCHLOROTHIAZIDE 25 MG PO TABS: 25.0000 mg | ORAL_TABLET | Freq: Every day | ORAL | 3 refills | Status: DC

## 2019-07-27 MED ORDER — DILTIAZEM HCL ER BEADS 300 MG PO CP24: 300.0000 mg | ORAL_CAPSULE | Freq: Every day | ORAL | 3 refills | Status: DC

## 2019-07-27 MED ORDER — CITALOPRAM HYDROBROMIDE 20 MG PO TABS: 20.0000 mg | ORAL_TABLET | Freq: Every day | ORAL | 3 refills | Status: DC

## 2019-07-27 MED ORDER — ATORVASTATIN CALCIUM 40 MG PO TABS: 40.0000 mg | ORAL_TABLET | Freq: Every day | ORAL | 3 refills | Status: DC

## 2019-07-27 MED ORDER — ALBUTEROL SULFATE HFA 108 (90 BASE) MCG/ACT IN AERS
2.0000 | INHALATION_SPRAY | Freq: Four times a day (QID) | RESPIRATORY_TRACT | 5 refills | Status: DC | PRN
Start: 2019-07-27 — End: 2019-08-01

## 2019-07-27 NOTE — Assessment & Plan Note (Signed)
Discussed importance of healthy weight management Discussed diet and exercise  

## 2019-07-27 NOTE — Patient Instructions (Signed)

## 2019-07-27 NOTE — Assessment & Plan Note (Signed)
Well controlled Continue current medications Recheck metabolic panel F/u in 6 months  

## 2019-07-27 NOTE — Progress Notes (Signed)
I,Laura E Walsh,acting as a scribe for Lavon Paganini, MD.,have documented all relevant documentation on the behalf of Lavon Paganini, MD,as directed by  Lavon Paganini, MD while in the presence of Lavon Paganini, MD.   Established patient visit   Patient: Isaac Watkins   DOB: 19-Mar-1940   79 y.o. Male  MRN: [id number] Visit Date: 07/27/2019  Today's healthcare provider: Lavon Paganini, MD   Chief Complaint  Patient presents with  . Hypertension  . Hyperlipidemia  . Shoulder Pain    Left shoulder   Subjective    Shoulder Pain  The pain is present in the left shoulder. This is a chronic problem. The problem has been gradually improving (Pt reports the shoulder pain has slightly improved ). Associated symptoms include a limited range of motion. Pertinent negatives include no inability to bear weight, joint locking, joint swelling, numbness, stiffness or tingling.    Hypertension, follow-up  BP Readings from Last 3 Encounters:  07/27/19 128/68  06/30/19 110/75  03/14/19 133/87   Wt Readings from Last 3 Encounters:  07/27/19 237 lb (107.5 kg)  06/30/19 234 lb (106.1 kg)  03/14/19 232 lb (105.2 kg)     He was last seen for hypertension 1 months ago.  Management since that visit includes no changes.  He reports excellent compliance with treatment. He is not having side effects.  He is following a Regular diet. He is exercising. He does not smoke.  Use of agents associated with hypertension: none.   Outside blood pressures are not being checked at home. Symptoms: No chest pain No chest pressure  No palpitations No syncope  No dyspnea No orthopnea  No paroxysmal nocturnal dyspnea No lower extremity edema   Pertinent labs: Lab Results  Component Value Date   CHOL 134 01/27/2019   HDL 52 01/27/2019   LDLCALC 62 01/27/2019   TRIG 108 01/27/2019   CHOLHDL 2.6 01/27/2019   Lab Results  Component Value Date   NA 138 06/30/2019   K 4.4  06/30/2019   CREATININE 2.15 (H) 06/30/2019   GFRNONAA 29 (L) 06/30/2019   GFRAA 33 (L) 06/30/2019   GLUCOSE 89 06/30/2019     The 10-year ASCVD risk score Mikey Bussing DC Jr., et al., 2013) is: 26.7%   --------------------------------------------------------------------------------------------------- Lipid/Cholesterol, Follow-up  Last lipid panel Other pertinent labs  Lab Results  Component Value Date   CHOL 134 01/27/2019   HDL 52 01/27/2019   LDLCALC 62 01/27/2019   TRIG 108 01/27/2019   CHOLHDL 2.6 01/27/2019   Lab Results  Component Value Date   ALT 22 01/27/2019   AST 24 01/27/2019   PLT 289 01/27/2019   TSH 2.97 03/05/2018     He was last seen for this 6 years ago.  Management since that visit includes no changes.  He reports excellent compliance with treatment. He is not having side effects.   Symptoms: No chest pain No chest pressure/discomfort  No dyspnea No lower extremity edema  No numbness or tingling of extremity No orthopnea  No palpitations No paroxysmal nocturnal dyspnea  No speech difficulty No syncope   Current diet: in general, a "healthy" diet   Current exercise: some  The 10-year ASCVD risk score Mikey Bussing DC Brooke Bonito., et al., 2013) is: 26.7%  ---------------------------------------------------------------------------------------------------   Patient Active Problem List   Diagnosis Date Noted  . Pure hypercholesterolemia 07/27/2019  . Pulmonary emphysema ([place]) 07/27/2019  . Aortic atherosclerosis ([place]) 07/27/2019  . Bursitis of left shoulder 06/30/2019  . Nontraumatic  incomplete tear of left rotator cuff 06/30/2019  . Class 1 obesity with serious comorbidity and body mass index (BMI) of 31.0 to 31.9 in adult 01/26/2019  . Hypertension 09/20/2018  . Adjustment disorder 09/20/2018  . BPH associated with nocturia 09/20/2018  . Seasonal allergic rhinitis due to pollen 09/20/2018   Past Medical History:  Diagnosis Date  . Allergic rhinitis   .  Hyperlipidemia   . Hypertension    Social History   Tobacco Use  . Smoking status: Former Smoker    Packs/day: 1.00    Years: 40.00    Pack years: 40.00    Types: Cigarettes    Quit date: 08/18/2011    Years since quitting: 7.9  . Smokeless tobacco: Never Used  Substance Use Topics  . Alcohol use: Yes    Alcohol/week: 21.0 standard drinks    Types: 21 Shots of liquor per week    Comment: 3 whiskey drinks a day  . Drug use: Never   No Known Allergies   Medications: Outpatient Medications Prior to Visit  Medication Sig  . [DISCONTINUED] atorvastatin (LIPITOR) 40 MG tablet Take 1 tablet (40 mg total) by mouth daily at 6 PM.  . [DISCONTINUED] citalopram (CELEXA) 20 MG tablet Take 1 tablet (20 mg total) by mouth daily.  . [DISCONTINUED] diltiazem (TIAZAC) 300 MG 24 hr capsule Take 1 capsule (300 mg total) by mouth daily.  . [DISCONTINUED] hydrochlorothiazide (HYDRODIURIL) 25 MG tablet Take 1 tablet (25 mg total) by mouth daily.  . [DISCONTINUED] losartan (COZAAR) 100 MG tablet Take 1 tablet (100 mg total) by mouth daily.   No facility-administered medications prior to visit.    Review of Systems  Constitutional: Negative.   Respiratory: Positive for shortness of breath. Negative for apnea, cough, choking, chest tightness, wheezing and stridor.   Cardiovascular: Negative.   Gastrointestinal: Negative.   Musculoskeletal: Positive for arthralgias. Negative for back pain, gait problem, joint swelling, myalgias, neck pain, neck stiffness and stiffness.  Neurological: Negative for dizziness, tingling, light-headedness, numbness and headaches.    Last CBC Lab Results  Component Value Date   WBC 6.5 01/27/2019   HGB 13.0 01/27/2019   HCT 37.3 (L) 01/27/2019   MCV 95 01/27/2019   MCH 33.2 (H) 01/27/2019   RDW 11.8 01/27/2019   PLT 289 01/27/2019   Last metabolic panel Lab Results  Component Value Date   GLUCOSE 89 06/30/2019   NA 138 06/30/2019   K 4.4 06/30/2019   CL 96  06/30/2019   CO2 24 06/30/2019   BUN 33 (H) 06/30/2019   CREATININE 2.15 (H) 06/30/2019   GFRNONAA 29 (L) 06/30/2019   GFRAA 33 (L) 06/30/2019   CALCIUM 9.7 06/30/2019   PROT 6.9 01/27/2019   ALBUMIN 4.5 01/27/2019   LABGLOB 2.4 01/27/2019   AGRATIO 1.9 01/27/2019   BILITOT 0.5 01/27/2019   ALKPHOS 100 01/27/2019   AST 24 01/27/2019   ALT 22 01/27/2019   Last lipids Lab Results  Component Value Date   CHOL 134 01/27/2019   HDL 52 01/27/2019   LDLCALC 62 01/27/2019   TRIG 108 01/27/2019   CHOLHDL 2.6 01/27/2019   Last hemoglobin A1c No results found for: HGBA1C Last thyroid functions Lab Results  Component Value Date   TSH 2.97 03/05/2018   Last vitamin D No results found for: 25OHVITD2, 25OHVITD3, VD25OH Last vitamin B12 and Folate Lab Results  Component Value Date   VITAMINB12 242.2 03/05/2018    Objective    BP 128/68 (BP Location:  Left Arm, Patient Position: Sitting, Cuff Size: Large)   Pulse 70   Temp (!) 97.5 F (36.4 C) (Temporal)   Wt 237 lb (107.5 kg)   SpO2 96%   BMI 32.14 kg/m  BP Readings from Last 3 Encounters:  07/27/19 128/68  06/30/19 110/75  03/14/19 133/87      Physical Exam Constitutional:      General: He is not in acute distress.    Appearance: Normal appearance.  HENT:     Head: Normocephalic and atraumatic.  Eyes:     Conjunctiva/sclera: Conjunctivae normal.  Cardiovascular:     Rate and Rhythm: Normal rate and regular rhythm.     Pulses: Normal pulses.     Heart sounds: Normal heart sounds. No murmur.  Pulmonary:     Effort: Pulmonary effort is normal. No respiratory distress.     Breath sounds: Normal breath sounds. No wheezing.  Abdominal:     Palpations: Abdomen is soft.     Tenderness: There is no abdominal tenderness.  Musculoskeletal:     Right lower leg: No edema.     Left lower leg: No edema.  Skin:    General: Skin is warm and dry.  Neurological:     Mental Status: He is alert and oriented to person,  place, and time. Mental status is at baseline.  Psychiatric:        Mood and Affect: Mood normal.        Behavior: Behavior normal.        Thought Content: Thought content normal.        Judgment: Judgment normal.       No results found for any visits on 07/27/19.  Assessment & Plan      Problem List Items Addressed This Visit      Cardiovascular and Mediastinum   Hypertension - Primary    Well controlled Continue current medications Recheck metabolic panel F/u in 6 months       Relevant Medications   atorvastatin (LIPITOR) 40 MG tablet   hydrochlorothiazide (HYDRODIURIL) 25 MG tablet   losartan (COZAAR) 100 MG tablet   diltiazem (TIAZAC) 300 MG 24 hr capsule   Aortic atherosclerosis (HCC)    Noted on CT chest Discussed importance of good blood pressure and cholesterol control Continue statin      Relevant Medications   atorvastatin (LIPITOR) 40 MG tablet   hydrochlorothiazide (HYDRODIURIL) 25 MG tablet   losartan (COZAAR) 100 MG tablet   diltiazem (TIAZAC) 300 MG 24 hr capsule     Respiratory   Pulmonary emphysema (H[facility])    Incidental finding on CT scan Pt reports shortness of breath with exercising.  Will try albuterol inhaler as needed.         Relevant Medications   albuterol (VENTOLIN HFA) 108 (90 Base) MCG/ACT inhaler     Musculoskeletal and Integument   Bursitis of left shoulder    Chronic left shoulder pain.  Pt reports some mild improvement.  Unable to get an appointment with physical therapy until June.  Pt declined to proceed with the referral at this time. Pt was given some home exercises to try.  Advised pt to take tylenol for the pain and to avoid NSAIDs secondary to kidney function. Consider Ortho referral in the future.          Other   Class 1 obesity with serious comorbidity and body mass index (BMI) of 31.0 to 31.9 in adult    Discussed importance of healthy weight management  Discussed diet and exercise       Pure  hypercholesterolemia    Previously well controlled Continue statin Repeat FLP and CMP Goal LDL < 70       Relevant Medications   atorvastatin (LIPITOR) 40 MG tablet   hydrochlorothiazide (HYDRODIURIL) 25 MG tablet   losartan (COZAAR) 100 MG tablet   diltiazem (TIAZAC) 300 MG 24 hr capsule       Return in about 6 months (around 01/27/2020) for For AWV.      I, Shirlee Latch, MD, have reviewed all documentation for this visit. The documentation on 07/27/19 for the exam, diagnosis, procedures, and orders are all accurate and complete.   Terrina Docter, Marzella Schlein, MD, MPH [facility]   [facility]

## 2019-07-27 NOTE — Assessment & Plan Note (Signed)
Previously well controlled Continue statin Repeat FLP and CMP Goal LDL < 70 

## 2019-07-27 NOTE — Assessment & Plan Note (Signed)
Chronic left shoulder pain.  Pt reports some mild improvement.  Unable to get an appointment with physical therapy until June.  Pt declined to proceed with the referral at this time. Pt was given some home exercises to try.  Advised pt to take tylenol for the pain and to avoid NSAIDs secondary to kidney function. Consider Ortho referral in the future.

## 2019-07-27 NOTE — Assessment & Plan Note (Signed)
Noted on CT chest Discussed importance of good blood pressure and cholesterol control Continue statin

## 2019-07-27 NOTE — Assessment & Plan Note (Signed)
Incidental finding on CT scan Pt reports shortness of breath with exercising.  Will try albuterol inhaler as needed.

## 2019-07-28 ENCOUNTER — Telehealth: Payer: Self-pay

## 2019-07-28 LAB — BASIC METABOLIC PANEL
BUN/Creatinine Ratio: 19 (ref 10–24)
BUN: 22 mg/dL (ref 8–27)
CO2: 22 mmol/L (ref 20–29)
Calcium: 9.5 mg/dL (ref 8.6–10.2)
Chloride: 99 mmol/L (ref 96–106)
Creatinine, Ser: 1.13 mg/dL (ref 0.76–1.27)
GFR calc Af Amer: 73 mL/min/{1.73_m2} (ref >59)
GFR calc non Af Amer: 63 mL/min/{1.73_m2} (ref >59)
Glucose: 86 mg/dL (ref 65–99)
Potassium: 4.2 mmol/L (ref 3.5–5.2)
Sodium: 136 mmol/L (ref 134–144)

## 2019-07-28 NOTE — Telephone Encounter (Signed)
Patient advised as below.  

## 2019-07-28 NOTE — Telephone Encounter (Signed)
-----   Message from Erasmo Downer, MD sent at 07/28/2019  8:16 AM EDT ----- Kidney function is back to normal.

## 2019-08-01 ENCOUNTER — Telehealth: Payer: Self-pay | Admitting: Family Medicine

## 2019-08-01 MED ORDER — ALBUTEROL SULFATE HFA 108 (90 BASE) MCG/ACT IN AERS: 2.0000 | INHALATION_SPRAY | Freq: Four times a day (QID) | RESPIRATORY_TRACT | 5 refills | Status: DC | PRN

## 2019-08-01 NOTE — Telephone Encounter (Signed)
Pt would like you to resend his Rx for albuterol (VENTOLIN HFA) 108 (90 Base) MCG/ACT inhaler To [id number] [place], [place] - [address] AT [facility] [address] Phone:  [phone]  Fax:  [phone]      He states  Rx sent to the wrong [facility] and too much trouble for them to have transferred over.  Pt declined to do ask [facility] to do himself. He states this has happened before.  Correct pharmacy confirmed and updated.

## 2019-08-01 NOTE — Telephone Encounter (Signed)
Requested Prescriptions  Pending Prescriptions Disp Refills  . albuterol (VENTOLIN HFA) 108 (90 Base) MCG/ACT inhaler 18 g 5    Sig: Inhale 2 puffs into the lungs every 6 (six) hours as needed for wheezing or shortness of breath.     Pulmonology:  Beta Agonists Failed - 08/01/2019  9:04 AM      Failed - One inhaler should last at least one month. If the patient is requesting refills earlier, contact the patient to check for uncontrolled symptoms.      Passed - Valid encounter within last 12 months    Recent Outpatient Visits          5 days ago Essential hypertension   [facility] [place], Marzella Schlein, MD   1 month ago Nontraumatic incomplete tear of left rotator cuff   [facility] [place], Marzella Schlein, MD   4 months ago Acute pain of left shoulder   [facility] [place] [place], Marzella Schlein, MD   6 months ago Encounter for annual physical exam   [facility], Marzella Schlein, MD   10 months ago Essential hypertension   [facility], Marzella Schlein, MD              Resent refill to correct [facility], per pt. request.

## 2019-08-10 MED ORDER — ALBUTEROL SULFATE HFA 108 (90 BASE) MCG/ACT IN AERS: 2.0000 | INHALATION_SPRAY | Freq: Four times a day (QID) | RESPIRATORY_TRACT | 5 refills | Status: DC | PRN

## 2019-08-10 NOTE — Telephone Encounter (Signed)
Per pt. Request, needs medications sent to different [facility] location; [address] [place].  Will resend Albuterol Inhaler, Losartan, and HCTZ, per pt. request

## 2019-08-10 NOTE — Telephone Encounter (Signed)
Pt calling stated tht meds. Where sent to the wrong [facility]. Pt needs losartan, Hctz 25 mg, albuterol inhaler [facility] [address]. Pt does not want to call have [facility]s [facility].

## 2019-08-10 NOTE — Telephone Encounter (Signed)
Reviewed meds and noted that the HCTZ and Losartan was sent to the [facility] @ [address] [place] on 07/27/19.  Will send the Albuterol Inhaler to correct pharmacy at this time.

## 2019-08-10 NOTE — Telephone Encounter (Signed)
Call placed to pt.  Informed that Albuterol was sent to the correct [facility] at [address], [place]. Informed pt. that the Losartan and HCTZ were ordered on 07/27/19 at same pharmacy.  Pt. Stated he was told that they did not have any record of those medications being ordered.  Advised will call Pharmacy on his behalf.   Called [facility] at same address noted above.  Was advised that they do have both the HCTZ and Losartan on file, and that they had put them back in stock since it had been at 12 days ago.  Pharmacist stated the Losartan can be picked up today, but the HCTZ will not go through for insurance approval until 10/08/19.  Called pt. Back.  Advised of above information from [employer].  Pt. Stated that he has not had any HCTZ filled for about 90 days, and he is out.  Advised to go into [facility], and discuss with them.  Pt. Verb. Understanding.

## 2019-08-10 NOTE — Addendum Note (Signed)
Addended by: Phillips Odor on: 08/10/2019 11:46 AM   Modules accepted: Orders

## 2019-09-13 ENCOUNTER — Telehealth: Payer: Self-pay

## 2019-09-13 NOTE — Telephone Encounter (Signed)
Apt for 09/19/2019  Thanks,   -Vernona Rieger

## 2019-09-13 NOTE — Telephone Encounter (Signed)
Copied from CRM [id number]. Topic: General - Other >> Sep 13, 2019  2:13 PM Maebelle Munroe wrote: Reason for CRM: Patient calling to see if it is possible to squeeze into schedule for cortisone shot. Patient has appt for 8/18

## 2019-09-19 ENCOUNTER — Other Ambulatory Visit: Payer: Self-pay

## 2019-09-19 ENCOUNTER — Encounter: Payer: Self-pay | Admitting: Physician Assistant

## 2019-09-19 ENCOUNTER — Ambulatory Visit (INDEPENDENT_AMBULATORY_CARE_PROVIDER_SITE_OTHER): Payer: Medicare Other | Admitting: Physician Assistant

## 2019-09-19 VITALS — BP 144/88 | HR 73 | Temp 97.2°F | Resp 16 | Wt 233.2 lb

## 2019-09-19 DIAGNOSIS — M7552 Bursitis of left shoulder: Secondary | ICD-10-CM | POA: Diagnosis not present

## 2019-09-19 DIAGNOSIS — S46012D Strain of muscle(s) and tendon(s) of the rotator cuff of left shoulder, subsequent encounter: Secondary | ICD-10-CM

## 2019-09-19 MED ORDER — METHYLPREDNISOLONE ACETATE 40 MG/ML IJ SUSP
80.0000 mg | Freq: Once | INTRAMUSCULAR | Status: AC
  Administered 2019-09-19: 80 mg via INTRA_ARTICULAR

## 2019-09-19 MED ORDER — LIDOCAINE HCL (PF) 1 % IJ SOLN
4.0000 mL | Freq: Once | INTRAMUSCULAR | Status: AC
  Administered 2019-09-19: 4 mL

## 2019-09-19 NOTE — Progress Notes (Signed)
Established patient visit   Patient: Isaac Watkins   DOB: 1940/05/23   79 y.o. Male  MRN: [id number] Visit Date: 09/19/2019  Today's healthcare provider: Margaretann Loveless, PA-C   Chief Complaint  Patient presents with  . Shoulder Pain   Subjective    HPI  Patient here with c/o shoulder pain, left. This is a chronic problem. He would like to have a Cortisone shot.  Patient Active Problem List   Diagnosis Date Noted  . Pure hypercholesterolemia 07/27/2019  . Pulmonary emphysema ([facility]) 07/27/2019  . Aortic atherosclerosis ([facility]) 07/27/2019  . Bursitis of left shoulder 06/30/2019  . Nontraumatic incomplete tear of left rotator cuff 06/30/2019  . Class 1 obesity with serious comorbidity and body mass index (BMI) of 31.0 to 31.9 in adult 01/26/2019  . Hypertension 09/20/2018  . Adjustment disorder 09/20/2018  . BPH associated with nocturia 09/20/2018  . Seasonal allergic rhinitis due to pollen 09/20/2018   Past Medical History:  Diagnosis Date  . Allergic rhinitis   . Hyperlipidemia   . Hypertension        Medications: Outpatient Medications Prior to Visit  Medication Sig  . albuterol (VENTOLIN HFA) 108 (90 Base) MCG/ACT inhaler Inhale 2 puffs into the lungs every 6 (six) hours as needed for wheezing or shortness of breath.  Marland Kitchen atorvastatin (LIPITOR) 40 MG tablet Take 1 tablet (40 mg total) by mouth daily at 6 PM.  . citalopram (CELEXA) 20 MG tablet Take 1 tablet (20 mg total) by mouth daily.  Marland Kitchen diltiazem (TIAZAC) 300 MG 24 hr capsule Take 1 capsule (300 mg total) by mouth daily.  . hydrochlorothiazide (HYDRODIURIL) 25 MG tablet Take 1 tablet (25 mg total) by mouth daily.  Marland Kitchen losartan (COZAAR) 100 MG tablet Take 1 tablet (100 mg total) by mouth daily.   No facility-administered medications prior to visit.    Review of Systems  Respiratory: Negative for chest tightness and wheezing.   Cardiovascular: Negative for chest pain, palpitations and leg swelling.     Last CBC Lab Results  Component Value Date   WBC 6.5 01/27/2019   HGB 13.0 01/27/2019   HCT 37.3 (L) 01/27/2019   MCV 95 01/27/2019   MCH 33.2 (H) 01/27/2019   RDW 11.8 01/27/2019   PLT 289 01/27/2019   Last metabolic panel Lab Results  Component Value Date   GLUCOSE 86 07/27/2019   NA 136 07/27/2019   K 4.2 07/27/2019   CL 99 07/27/2019   CO2 22 07/27/2019   BUN 22 07/27/2019   CREATININE 1.13 07/27/2019   GFRNONAA 63 07/27/2019   GFRAA 73 07/27/2019   CALCIUM 9.5 07/27/2019   PROT 6.9 01/27/2019   ALBUMIN 4.5 01/27/2019   LABGLOB 2.4 01/27/2019   AGRATIO 1.9 01/27/2019   BILITOT 0.5 01/27/2019   ALKPHOS 100 01/27/2019   AST 24 01/27/2019   ALT 22 01/27/2019      Objective    BP (!) 144/88 (BP Location: Left Arm, Patient Position: Sitting, Cuff Size: Large)   Pulse 73   Temp (!) 97.2 F (36.2 C) (Temporal)   Resp 16   Wt 233 lb 3.2 oz (105.8 kg)   BMI 31.63 kg/m  BP Readings from Last 3 Encounters:  09/19/19 (!) 144/88  07/27/19 128/68  06/30/19 110/75   Wt Readings from Last 3 Encounters:  09/19/19 233 lb 3.2 oz (105.8 kg)  07/27/19 237 lb (107.5 kg)  06/30/19 234 lb (106.1 kg)      Physical  Exam Vitals reviewed.  Constitutional:      General: He is not in acute distress.    Appearance: Normal appearance. He is well-developed. He is obese. He is not ill-appearing or diaphoretic.  HENT:     Head: Normocephalic and atraumatic.  Neck:     Thyroid: No thyromegaly.     Vascular: No JVD.     Trachea: No tracheal deviation.  Cardiovascular:     Rate and Rhythm: Normal rate and regular rhythm.     Pulses: Normal pulses.     Heart sounds: Normal heart sounds. No murmur heard.  No friction rub. No gallop.   Pulmonary:     Effort: Pulmonary effort is normal. No respiratory distress.     Breath sounds: Normal breath sounds. No wheezing or rales.  Musculoskeletal:     Cervical back: Normal range of motion and neck supple.  Lymphadenopathy:      Cervical: No cervical adenopathy.  Neurological:     Mental Status: He is alert.       No results found for any visits on 09/19/19.  Assessment & Plan     1. Traumatic incomplete tear of left rotator cuff, subsequent encounter Last one given in April 2021 by Ortho in [place]. Steroid injection given as below, see procedure note. After care instructions printed on AVS.  - methylPREDNISolone acetate (DEPO-MEDROL) injection 80 mg - lidocaine (PF) (XYLOCAINE) 1 % injection 4 mL  2. Chronic bursitis of left shoulder See above medical treatment plan. - methylPREDNISolone acetate (DEPO-MEDROL) injection 80 mg - lidocaine (PF) (XYLOCAINE) 1 % injection 4 mL  Procedure Note: Benefits, risks (including infection, tattooing, adipose dimpling, and tendon rupture) and alternatives were explained to the patient. All questions were sought and answered.  Patient agreed to continue and verbal consent was obtained.   A steroid injection was performed on right shoulder using 4cc of 1% plain Xyloocaine and 80 mg of depo-medrol. This was well tolerated.  No follow-ups on file.      Delmer Islam, PA-C, have reviewed all documentation for this visit. The documentation on 09/21/19 for the exam, diagnosis, procedures, and orders are all accurate and complete.   Reine Just  [facility] [phone] (phone) [phone] (fax)  [facility]

## 2019-09-19 NOTE — Patient Instructions (Signed)
Shoulder Injection, Care After Refer to this sheet in the next few weeks. These instructions provide you with information about caring for yourself after your procedure. Your health care provider may also give you more specific instructions. Your treatment has been planned according to current medical practices, but problems sometimes occur. Call your health care provider if you have any problems or questions after your procedure. WHAT TO EXPECT AFTER THE PROCEDURE After your procedure, it is common to have:  Soreness.  Warmth.  Swelling. You may have more pain, swelling, and warmth than you did before the injection. This reaction may last for about one day.  HOME CARE INSTRUCTIONS Bathing  If you were given a bandage (dressing), keep it dry until your health care provider says it can be removed. Ask your health care provider when you can start showering or taking a bath. Managing Pain, Stiffness, and Swelling  If directed, apply ice to the injection area:  Put ice in a plastic bag.  Place a towel between your skin and the bag.  Leave the ice on for 20 minutes, 2-3 times per day.  Do not apply heat to your shoulder.  Raise the injection area above the level of your heart while you are sitting or lying down. Activity  Avoid strenuous activities for as long as directed by your health care provider. Ask your health care provider when you can return to your normal activities. General Instructions  Take medicines only as directed by your health care provider.  Do not take aspirin or other over-the-counter medicines unless your health care provider says you can.  Check your injection site every day for signs of infection. Watch for:  Redness, swelling, or pain.  Fluid, blood, or pus.  Follow your health care provider's instructions about dressing changes and removal. SEEK MEDICAL CARE IF:  You have symptoms at your injection site that last longer than two days after your  procedure.  You have redness, swelling, or pain in your injection area.  You have fluid, blood, or pus coming from your injection site.  You have warmth in your injection area.  You have a fever.  Your pain is not controlled with medicine. SEEK IMMEDIATE MEDICAL CARE IF:  Your shoulder turns very red.  Your shoulder becomes very swollen.  Your shoulder pain is severe.   This information is not intended to replace advice given to you by your health care provider. Make sure you discuss any questions you have with your health care provider.   Document Released: 03/17/2014 Document Reviewed: 03/17/2014 Elsevier Interactive Patient Education 2016 Elsevier Inc.  

## 2019-09-21 ENCOUNTER — Encounter: Payer: Self-pay | Admitting: Physician Assistant

## 2019-10-26 ENCOUNTER — Ambulatory Visit: Payer: Medicare Other | Admitting: Family Medicine

## 2019-11-02 ENCOUNTER — Other Ambulatory Visit: Payer: Self-pay

## 2019-11-02 DIAGNOSIS — Z1211 Encounter for screening for malignant neoplasm of colon: Secondary | ICD-10-CM

## 2019-11-02 NOTE — Progress Notes (Signed)
Referral requested through Mychart wife Chaos Carlile.

## 2019-11-08 ENCOUNTER — Encounter: Payer: Self-pay | Admitting: *Deleted

## 2019-11-17 ENCOUNTER — Ambulatory Visit: Payer: Medicare Other

## 2019-12-14 ENCOUNTER — Ambulatory Visit: Payer: Medicare Other | Admitting: Physician Assistant

## 2019-12-21 NOTE — Progress Notes (Signed)
Established patient visit   Patient: Isaac Watkins   DOB: November 15, 1940   79 y.o. Male  MRN: [id number] Visit Date: 12/23/2019  Today's healthcare provider: Margaretann Loveless, PA-C   Chief Complaint  Patient presents with  . Shoulder Pain   Subjective    HPI  Shoulder Pain: patient with hx of Traumatic incomplete tear of left rotator cuff wants a steroid injection. This will be his 3rd. He has had one in [place] in April, one here in July and then today.   Patient Active Problem List   Diagnosis Date Noted  . Pure hypercholesterolemia 07/27/2019  . Pulmonary emphysema (HCC) 07/27/2019  . Aortic atherosclerosis (HCC) 07/27/2019  . Bursitis of left shoulder 06/30/2019  . Nontraumatic incomplete tear of left rotator cuff 06/30/2019  . Class 1 obesity with serious comorbidity and body mass index (BMI) of 31.0 to 31.9 in adult 01/26/2019  . Hypertension 09/20/2018  . Adjustment disorder 09/20/2018  . BPH associated with nocturia 09/20/2018  . Seasonal allergic rhinitis due to pollen 09/20/2018   Past Medical History:  Diagnosis Date  . Allergic rhinitis   . Hyperlipidemia   . Hypertension        Medications: Outpatient Medications Prior to Visit  Medication Sig  . albuterol (VENTOLIN HFA) 108 (90 Base) MCG/ACT inhaler Inhale 2 puffs into the lungs every 6 (six) hours as needed for wheezing or shortness of breath.  Marland Kitchen atorvastatin (LIPITOR) 40 MG tablet Take 1 tablet (40 mg total) by mouth daily at 6 PM.  . citalopram (CELEXA) 20 MG tablet Take 1 tablet (20 mg total) by mouth daily.  Marland Kitchen diltiazem (TIAZAC) 300 MG 24 hr capsule Take 1 capsule (300 mg total) by mouth daily.  . hydrochlorothiazide (HYDRODIURIL) 25 MG tablet Take 1 tablet (25 mg total) by mouth daily.  Marland Kitchen losartan (COZAAR) 100 MG tablet Take 1 tablet (100 mg total) by mouth daily.   No facility-administered medications prior to visit.    Review of Systems  Constitutional: Negative.   Respiratory:  Negative.   Cardiovascular: Negative.   Musculoskeletal: Positive for arthralgias.  Neurological: Negative for weakness and numbness.    Last CBC Lab Results  Component Value Date   WBC 6.5 01/27/2019   HGB 13.0 01/27/2019   HCT 37.3 (L) 01/27/2019   MCV 95 01/27/2019   MCH 33.2 (H) 01/27/2019   RDW 11.8 01/27/2019   PLT 289 01/27/2019   Last metabolic panel Lab Results  Component Value Date   GLUCOSE 86 07/27/2019   NA 136 07/27/2019   K 4.2 07/27/2019   CL 99 07/27/2019   CO2 22 07/27/2019   BUN 22 07/27/2019   CREATININE 1.13 07/27/2019   GFRNONAA 63 07/27/2019   GFRAA 73 07/27/2019   CALCIUM 9.5 07/27/2019   PROT 6.9 01/27/2019   ALBUMIN 4.5 01/27/2019   LABGLOB 2.4 01/27/2019   AGRATIO 1.9 01/27/2019   BILITOT 0.5 01/27/2019   ALKPHOS 100 01/27/2019   AST 24 01/27/2019   ALT 22 01/27/2019      Objective    BP 118/61 (BP Location: Right Arm, Patient Position: Sitting, Cuff Size: Large)   Pulse 70   Temp 98.9 F (37.2 C) (Oral)   Resp 16   Wt 231 lb 6.4 oz (105 kg)   BMI 31.38 kg/m  BP Readings from Last 3 Encounters:  12/23/19 118/61  09/19/19 (!) 144/88  07/27/19 128/68   Wt Readings from Last 3 Encounters:  12/23/19 231 lb 6.4  oz (105 kg)  09/19/19 233 lb 3.2 oz (105.8 kg)  07/27/19 237 lb (107.5 kg)      Physical Exam Vitals reviewed.  Constitutional:      General: He is not in acute distress.    Appearance: Normal appearance. He is well-developed. He is obese. He is not ill-appearing or diaphoretic.  HENT:     Head: Normocephalic and atraumatic.  Cardiovascular:     Rate and Rhythm: Normal rate and regular rhythm.     Heart sounds: Normal heart sounds. No murmur heard.  No friction rub. No gallop.   Pulmonary:     Effort: Pulmonary effort is normal. No respiratory distress.     Breath sounds: Normal breath sounds. No wheezing or rales.  Musculoskeletal:     Cervical back: Normal range of motion and neck supple.  Skin:    General:  Skin is warm and dry.  Neurological:     Mental Status: He is alert.      No results found for any visits on 12/23/19.  Assessment & Plan     1. 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 Tolerated well, see procedure note. If fails will refer to Orthopedics locally. He agrees with plan.  - methylPREDNISolone acetate (DEPO-MEDROL) injection 80 mg - lidocaine (PF) (XYLOCAINE) 1 % injection 4 mL  Procedure Note: Benefits, risks (including infection, tattooing, adipose dimpling, and tendon rupture) and alternatives were explained to the patient. All questions were sought and answered.  Patient agreed to continue and verbal consent was obtained.   A steroid injection was performed on left shoulder using 4cc of 1% plain Xyloocaine and 80 mg of depo-medrol. This was well tolerated.  No follow-ups on file.      Delmer Islam, PA-C, have reviewed all documentation for this visit. The documentation on 12/23/19 for the exam, diagnosis, procedures, and orders are all accurate and complete.   Reine Just  [facility] [phone] (phone) [phone] (fax)  [facility]

## 2019-12-23 ENCOUNTER — Other Ambulatory Visit: Payer: Self-pay

## 2019-12-23 ENCOUNTER — Ambulatory Visit (INDEPENDENT_AMBULATORY_CARE_PROVIDER_SITE_OTHER): Payer: Medicare Other | Admitting: Physician Assistant

## 2019-12-23 ENCOUNTER — Encounter: Payer: Self-pay | Admitting: Physician Assistant

## 2019-12-23 VITALS — BP 118/61 | HR 70 | Temp 98.9°F | Resp 16 | Wt 231.4 lb

## 2019-12-23 DIAGNOSIS — S46012D Strain of muscle(s) and tendon(s) of the rotator cuff of left shoulder, subsequent encounter: Secondary | ICD-10-CM

## 2019-12-23 MED ORDER — LIDOCAINE HCL (PF) 1 % IJ SOLN
4.0000 mL | Freq: Once | INTRAMUSCULAR | Status: AC
Start: 2019-12-23 — End: 2019-12-23
  Administered 2019-12-23: 4 mL

## 2019-12-23 MED ORDER — METHYLPREDNISOLONE ACETATE 40 MG/ML IJ SUSP
80.0000 mg | Freq: Once | INTRAMUSCULAR | Status: AC
  Administered 2019-12-23: 80 mg via INTRA_ARTICULAR

## 2019-12-23 NOTE — Patient Instructions (Signed)
Shoulder Injection, Care After Refer to this sheet in the next few weeks. These instructions provide you with information about caring for yourself after your procedure. Your health care provider may also give you more specific instructions. Your treatment has been planned according to current medical practices, but problems sometimes occur. Call your health care provider if you have any problems or questions after your procedure. WHAT TO EXPECT AFTER THE PROCEDURE After your procedure, it is common to have:  Soreness.  Warmth.  Swelling. You may have more pain, swelling, and warmth than you did before the injection. This reaction may last for about one day.  HOME CARE INSTRUCTIONS Bathing  If you were given a bandage (dressing), keep it dry until your health care provider says it can be removed. Ask your health care provider when you can start showering or taking a bath. Managing Pain, Stiffness, and Swelling  If directed, apply ice to the injection area:  Put ice in a plastic bag.  Place a towel between your skin and the bag.  Leave the ice on for 20 minutes, 2-3 times per day.  Do not apply heat to your shoulder.  Raise the injection area above the level of your heart while you are sitting or lying down. Activity  Avoid strenuous activities for as long as directed by your health care provider. Ask your health care provider when you can return to your normal activities. General Instructions  Take medicines only as directed by your health care provider.  Do not take aspirin or other over-the-counter medicines unless your health care provider says you can.  Check your injection site every day for signs of infection. Watch for:  Redness, swelling, or pain.  Fluid, blood, or pus.  Follow your health care provider's instructions about dressing changes and removal. SEEK MEDICAL CARE IF:  You have symptoms at your injection site that last longer than two days after your  procedure.  You have redness, swelling, or pain in your injection area.  You have fluid, blood, or pus coming from your injection site.  You have warmth in your injection area.  You have a fever.  Your pain is not controlled with medicine. SEEK IMMEDIATE MEDICAL CARE IF:  Your shoulder turns very red.  Your shoulder becomes very swollen.  Your shoulder pain is severe.   This information is not intended to replace advice given to you by your health care provider. Make sure you discuss any questions you have with your health care provider.   Document Released: 03/17/2014 Document Reviewed: 03/17/2014 Elsevier Interactive Patient Education 2016 Elsevier Inc.  

## 2020-01-30 ENCOUNTER — Other Ambulatory Visit: Payer: Self-pay | Admitting: *Deleted

## 2020-01-30 DIAGNOSIS — Z122 Encounter for screening for malignant neoplasm of respiratory organs: Secondary | ICD-10-CM

## 2020-01-30 DIAGNOSIS — Z87891 Personal history of nicotine dependence: Secondary | ICD-10-CM

## 2020-01-30 NOTE — Progress Notes (Signed)
Contacted and scheduled for lung screening scan. Patient is a former smoker, quit 08/18/11, 40 pack year history.

## 2020-02-06 NOTE — Progress Notes (Addendum)
Subjective:   Isaac Watkins is a 79 y.o. male who presents for Medicare Annual/Subsequent preventive examination.  Review of Systems    N/A  Cardiac Risk Factors include: advanced age (>34mn, >>13women);dyslipidemia;hypertension;male gender;obesity (BMI >30kg/m2);sedentary lifestyle     Objective:    Today's Vitals   02/07/20 0900  BP: 112/62  Pulse: 75  Temp: 97.9 F (36.6 C)  TempSrc: Oral  SpO2: 92%  Weight: 236 lb (107 kg)  Height: 6' (1.829 m)  PainSc: 0-No pain   Body mass index is 32.01 kg/m.  Advanced Directives 02/07/2020 01/25/2019  Does Patient Have a Medical Advance Directive? Yes Yes  Type of [facility]amed[facility]of [facility] will Living will  Copy of [place]? Yes - validated most recent copy scanned in chart (See row information) -    Current Medications (verified) Outpatient Encounter Medications as of 02/07/2020  Medication Sig   albuterol (VENTOLIN HFA) 108 (90 Base) MCG/ACT inhaler Inhale 2 puffs into the lungs every 6 (six) hours as needed for wheezing or shortness of breath.   atorvastatin (LIPITOR) 40 MG tablet Take 1 tablet (40 mg total) by mouth daily at 6 PM.   citalopram (CELEXA) 20 MG tablet Take 1 tablet (20 mg total) by mouth daily.   diltiazem (TIAZAC) 300 MG 24 hr capsule Take 1 capsule (300 mg total) by mouth daily.   hydrochlorothiazide (HYDRODIURIL) 25 MG tablet Take 1 tablet (25 mg total) by mouth daily.   losartan (COZAAR) 100 MG tablet Take 1 tablet (100 mg total) by mouth daily.   Magnesium Hydroxide (DULCOLAX PO) Take 2 tablets by mouth as needed.   No facility-administered encounter medications on file as of 02/07/2020.    Allergies (verified) Patient has no known allergies.   History: Past Medical History:  Diagnosis Date   Allergic rhinitis    Hyperlipidemia    Hypertension    Past Surgical History:  Procedure Laterality Date   CARPAL TUNNEL RELEASE  Left    CATARACT EXTRACTION     TONSILECTOMY, ADENOIDECTOMY, BILATERAL MYRINGOTOMY AND TUBES     Family History  Problem Relation Age of Onset   Pancreatic cancer Father    Pancreatic disease Father    COPD Father    Dementia Mother    Breast cancer Sister        BRCA 2 +   Brain cancer Paternal Grandfather    Heart disease Son    Heart attack Son    Prostate cancer Neg Hx    Bladder Cancer Neg Hx    Kidney cancer Neg Hx    Social History   Socioeconomic History   Marital status: Married    Spouse name: Not on file   Number of children: 3   Years of education: Not on file   Highest education level: Master's degree (e.g., MA, MS, MEng, MEd, MSW, MBA)  Occupational History   Occupation: retired    Comment: owned [employer]  Tobacco Use   Smoking status: Former Smoker    Packs/day: 1.00    Years: 40.00    Pack years: 40.00    Types: Cigarettes    Quit date: 08/18/2011    Years since quitting: 8.4   Smokeless tobacco: Never Used  Vaping Use   Vaping Use: Never used  Substance and Sexual Activity   Alcohol use: Yes    Alcohol/week: 21.0 standard drinks    Types: 21 Shots of liquor per week  Comment: 3 whiskey drinks a day   Drug use: Never   Sexual activity: Yes    Partners: Female    Birth control/protection: None  Other Topics Concern   Not on file  Social History Narrative   Not on file   Social Determinants of Health   Financial Resource Strain: Low Risk    Difficulty of Paying Living Expenses: Not hard at all  Food Insecurity: No Food Insecurity   Worried About Charity fundraiser in the Last Year: Never true   [place] in the Last Year: Never true  Transportation Needs: No Transportation Needs   Lack of Transportation (Medical): No   Lack of Transportation (Non-Medical): No  Physical Activity: Inactive   Days of Exercise per Week: 0 days   Minutes of Exercise per Session: 0 min  Stress: No  Stress Concern Present   Feeling of Stress : Not at all  Social Connections: Moderately Isolated   Frequency of Communication with Friends and Family: More than three times a week   Frequency of Social Gatherings with Friends and Family: More than three times a week   Attends Religious Services: Never   Marine scientist or Organizations: No   Attends Music therapist: Never   Marital Status: Married    Tobacco Counseling Counseling given: Not Answered   Clinical Intake:  Pre-visit preparation completed: Yes  Pain : No/denies pain Pain Score: 0-No pain     Nutritional Status: BMI > 30  Obese Nutritional Risks: None Diabetes: No  How often do you need to have someone help you when you read instructions, pamphlets, or other written materials from your doctor or pharmacy?: 1 - Never  Diabetic? No  Interpreter Needed?: No  Information entered by :: [facility], LPN   Activities of Daily Living In your present state of health, do you have any difficulty performing the following activities: 02/07/2020  Hearing? Y  Comment Does not wear hearing aids at this time.  Vision? N  Difficulty concentrating or making decisions? N  Walking or climbing stairs? N  Dressing or bathing? N  Doing errands, shopping? N  Preparing Food and eating ? N  Using the Toilet? N  In the past six months, have you accidently leaked urine? N  Do you have problems with loss of bowel control? N  Managing your Medications? N  Managing your Finances? N  Housekeeping or managing your Housekeeping? N  Some recent data might be hidden    Patient Care Team: Virginia Crews, MD as PCP - General (Family Medicine) Ralene Bathe, MD (Dermatology) Lorelee Cover., MD (Ophthalmology)  Indicate any recent Medical Services you may have received from other than [facility] providers in the past year (date may be approximate).     Assessment:   This is a routine wellness  examination for [name].  Hearing/Vision screen No exam data present  Dietary issues and exercise activities discussed: Current Exercise Habits: The patient does not participate in regular exercise at present, Exercise limited by: orthopedic condition(s);respiratory conditions(s)  Goals     DIET - INCREASE WATER INTAKE     Recommend to drink at least 6-8 8oz glasses of water per day.      Depression Screen PHQ 2/9 Scores 02/07/2020 01/25/2019 09/20/2018  PHQ - 2 Score 0 0 0  PHQ- 9 Score - - 0    Fall Risk Fall Risk  02/07/2020 01/25/2019 09/20/2018  Falls in the past year? 0  0 0  Number falls in past yr: 0 0 0  Injury with Fall? 0 0 -    Any stairs in or around the home? No  If so, are there any without handrails? No  Home free of loose throw rugs in walkways, pet beds, electrical cords, etc? Yes  Adequate lighting in your home to reduce risk of falls? Yes   ASSISTIVE DEVICES UTILIZED TO PREVENT FALLS:  Life alert? No  Use of a cane, walker or w/c? No  Grab bars in the bathroom? No  Shower chair or bench in shower? No  Elevated toilet seat or a handicapped toilet? No   TIMED UP AND GO:  Was the test performed? Yes .  Length of time to ambulate 10 feet: 9 sec.   Gait steady and fast without use of assistive device  Cognitive Function:     6CIT Screen 02/07/2020 01/25/2019  What Year? 0 points 0 points  What month? 0 points 0 points  What time? 0 points 0 points  Count back from 20 0 points 0 points  Months in reverse 0 points 0 points  Repeat phrase 0 points 0 points  Total Score 0 0    Immunizations Immunization History  Administered Date(s) Administered   Fluad Quad(high Dose 65+) 01/26/2019   Hepatitis A, Adult 01/13/2011, 07/10/2011   Influenza, High Dose Seasonal PF 01/07/2020   PFIZER SARS-COV-2 Vaccination 03/21/2019, 04/11/2019   Pneumococcal Conjugate-13 10/20/2013   Pneumococcal Polysaccharide-23 02/08/2009, 11/22/2014, 01/10/2018   Tdap  01/03/2016   Zoster 08/12/2008   Zoster Recombinat (Shingrix) 03/19/2017, 08/20/2017    TDAP status: Up to date Flu Vaccine status: Up to date Pneumococcal vaccine status: Up to date Covid-19 vaccine status: Completed vaccines  Qualifies for Shingles Vaccine? Yes   Zostavax completed Yes   Shingrix Completed?: Yes  Screening Tests Health Maintenance  Topic Date Due   TETANUS/TDAP  01/02/2026   INFLUENZA VACCINE  Completed   COVID-19 Vaccine  Completed   PNA vac Low Risk Adult  Completed    Health Maintenance  There are no preventive care reminders to display for this patient.  Colorectal cancer screening: No longer required.   Lung Cancer Screening: (Low Dose CT Chest recommended if Age 90-80 years, 30 pack-year currently smoking OR have quit w/in 15years.) does not qualify.    Additional Screening:  Vision Screening: Recommended annual ophthalmology exams for early detection of glaucoma and other disorders of the eye. Is the patient up to date with their annual eye exam?  Yes  Who is the provider or what is the name of the office in which the patient attends annual eye exams? Dr Gloriann Loan If pt is not established with a provider, would they like to be referred to a provider to establish care? No .   Dental Screening: Recommended annual dental exams for proper oral hygiene  Community Resource Referral / Chronic Care Management: CRR required this visit?  No   CCM required this visit?  No      Plan:     I have personally reviewed and noted the following in the patients chart:    Medical and social history  Use of alcohol, tobacco or illicit drugs   Current medications and supplements  Functional ability and status  Nutritional status  Physical activity  Advanced directives  List of other physicians  Hospitalizations, surgeries, and ER visits in previous 12 months  Vitals  Screenings to include cognitive, depression, and falls  Referrals and  appointments  In addition, I have reviewed and discussed with patient certain preventive protocols, quality metrics, and best practice recommendations. A written personalized care plan for preventive services as well as general preventive health recommendations were provided to patient.     Esraa Seres [place], [place]   [id number]   Nurse Notes: None.

## 2020-02-07 ENCOUNTER — Ambulatory Visit (INDEPENDENT_AMBULATORY_CARE_PROVIDER_SITE_OTHER): Payer: Medicare Other

## 2020-02-07 ENCOUNTER — Encounter: Payer: Self-pay | Admitting: Family Medicine

## 2020-02-07 ENCOUNTER — Ambulatory Visit (INDEPENDENT_AMBULATORY_CARE_PROVIDER_SITE_OTHER): Payer: Medicare Other | Admitting: Family Medicine

## 2020-02-07 ENCOUNTER — Other Ambulatory Visit: Payer: Self-pay

## 2020-02-07 VITALS — BP 112/62 | HR 64 | Temp 97.5°F | Resp 16 | Ht 72.0 in | Wt 236.0 lb

## 2020-02-07 VITALS — BP 112/62 | HR 75 | Temp 97.9°F | Ht 72.0 in | Wt 236.0 lb

## 2020-02-07 DIAGNOSIS — E669 Obesity, unspecified: Secondary | ICD-10-CM | POA: Diagnosis not present

## 2020-02-07 DIAGNOSIS — I700 Atherosclerosis of aorta: Secondary | ICD-10-CM | POA: Diagnosis not present

## 2020-02-07 DIAGNOSIS — E7800 Pure hypercholesterolemia, unspecified: Secondary | ICD-10-CM

## 2020-02-07 DIAGNOSIS — Z0000 Encounter for general adult medical examination without abnormal findings: Secondary | ICD-10-CM | POA: Diagnosis not present

## 2020-02-07 DIAGNOSIS — Z6832 Body mass index (BMI) 32.0-32.9, adult: Secondary | ICD-10-CM

## 2020-02-07 DIAGNOSIS — I10 Essential (primary) hypertension: Secondary | ICD-10-CM | POA: Diagnosis not present

## 2020-02-07 DIAGNOSIS — J439 Emphysema, unspecified: Secondary | ICD-10-CM

## 2020-02-07 DIAGNOSIS — M160 Bilateral primary osteoarthritis of hip: Secondary | ICD-10-CM | POA: Insufficient documentation

## 2020-02-07 DIAGNOSIS — K5900 Constipation, unspecified: Secondary | ICD-10-CM | POA: Insufficient documentation

## 2020-02-07 MED ORDER — SPIRIVA RESPIMAT 2.5 MCG/ACT IN AERS
2.0000 | INHALATION_SPRAY | Freq: Every day | RESPIRATORY_TRACT | 5 refills | Status: DC
Start: 2020-02-07 — End: 2020-04-16

## 2020-02-07 NOTE — Patient Instructions (Signed)
Mr. Isaac Watkins , Thank you for taking time to come for your Medicare Wellness Visit. I appreciate your ongoing commitment to your health goals. Please review the following plan we discussed and let me know if I can assist you in the future.   Screening recommendations/referrals: Colonoscopy: No longer required.  Recommended yearly ophthalmology/optometry visit for glaucoma screening and checkup Recommended yearly dental visit for hygiene and checkup  Vaccinations: Influenza vaccine: Done 12/2019 Pneumococcal vaccine: Completed series Tdap vaccine: Up to date, due 12/2025 Shingles vaccine: Completed series    Advanced directives: Currently on file.  Conditions/risks identified: Recommend to drink at least 6-8 8oz glasses of water per day.  Next appointment: 9:40 AM with Dr Beryle Flock   Preventive Care 77 Years and Older, Male Preventive care refers to lifestyle choices and visits with your health care provider that can promote health and wellness. What does preventive care include?  A yearly physical exam. This is also called an annual well check.  Dental exams once or twice a year.  Routine eye exams. Ask your health care provider how often you should have your eyes checked.  Personal lifestyle choices, including:  Daily care of your teeth and gums.  Regular physical activity.  Eating a healthy diet.  Avoiding tobacco and drug use.  Limiting alcohol use.  Practicing safe sex.  Taking low doses of aspirin every day.  Taking vitamin and mineral supplements as recommended by your health care provider. What happens during an annual well check? The services and screenings done by your health care provider during your annual well check will depend on your age, overall health, lifestyle risk factors, and family history of disease. Counseling  Your health care provider may ask you questions about your:  Alcohol use.  Tobacco use.  Drug use.  Emotional well-being.  Home  and relationship well-being.  Sexual activity.  Eating habits.  History of falls.  Memory and ability to understand (cognition).  Work and work Astronomer. Screening  You may have the following tests or measurements:  Height, weight, and BMI.  Blood pressure.  Lipid and cholesterol levels. These may be checked every 5 years, or more frequently if you are over 110 years old.  Skin check.  Lung cancer screening. You may have this screening every year starting at age 77 if you have a 30-pack-year history of smoking and currently smoke or have quit within the past 77 years  Fecal occult blood test (FOBT) of the stool. You may have this test every year starting at age 77.  Flexible sigmoidoscopy or colonoscopy. You may have a sigmoidoscopy every 5 years or a colonoscopy every 10 years starting at age 77.  Prostate cancer screening. Recommendations will vary depending on your family history and other risks.  Hepatitis C blood test.  Hepatitis B blood test.  Sexually transmitted disease (STD) testing.  Diabetes screening. This is done by checking your blood sugar (glucose) after you have not eaten for a while (fasting). You may have this done every 1-3 years.  Abdominal aortic aneurysm (AAA) screening. You may need this if you are a current or former smoker.  Osteoporosis. You may be screened starting at age 77 if you are at high risk. Talk with your health care provider about your test results, treatment options, and if necessary, the need for more tests. Vaccines  Your health care provider may recommend certain vaccines, such as:  Influenza vaccine. This is recommended every year.  Tetanus, diphtheria, and acellular pertussis (Tdap, Td)  vaccine. You may need a Td booster every 10 years.  Zoster vaccine. You may need this after age 77.  Pneumococcal 13-valent conjugate (PCV13) vaccine. One dose is recommended after age 77.  Pneumococcal polysaccharide (PPSV23) vaccine.  One dose is recommended after age 77. Talk to your health care provider about which screenings and vaccines you need and how often you need them. This information is not intended to replace advice given to you by your health care provider. Make sure you discuss any questions you have with your health care provider. Document Released: 03/23/2015 Document Revised: 11/14/2015 Document Reviewed: 12/26/2014 Elsevier Interactive Patient Education  2017 [place] Prevention in the Home Falls can cause injuries. They can happen to people of all ages. There are many things you can do to make your home safe and to help prevent falls. What can I do on the outside of my home?  Regularly fix the edges of walkways and driveways and fix any cracks.  Remove anything that might make you trip as you walk through a door, such as a raised step or threshold.  Trim any bushes or trees on the path to your home.  Use bright outdoor lighting.  Clear any walking paths of anything that might make someone trip, such as rocks or tools.  Regularly check to see if handrails are loose or broken. Make sure that both sides of any steps have handrails.  Any raised decks and porches should have guardrails on the edges.  Have any leaves, snow, or ice cleared regularly.  Use sand or salt on walking paths during winter.  Clean up any spills in your garage right away. This includes oil or grease spills. What can I do in the bathroom?  Use night lights.  Install grab bars by the toilet and in the tub and shower. Do not use towel bars as grab bars.  Use non-skid mats or decals in the tub or shower.  If you need to sit down in the shower, use a plastic, non-slip stool.  Keep the floor dry. Clean up any water that spills on the floor as soon as it happens.  Remove soap buildup in the tub or shower regularly.  Attach bath mats securely with double-sided non-slip rug tape.  Do not have throw rugs and other  things on the floor that can make you trip. What can I do in the bedroom?  Use night lights.  Make sure that you have a light by your bed that is easy to reach.  Do not use any sheets or blankets that are too big for your bed. They should not hang down onto the floor.  Have a firm chair that has side arms. You can use this for support while you get dressed.  Do not have throw rugs and other things on the floor that can make you trip. What can I do in the kitchen?  Clean up any spills right away.  Avoid walking on wet floors.  Keep items that you use a lot in easy-to-reach places.  If you need to reach something above you, use a strong step stool that has a grab bar.  Keep electrical cords out of the way.  Do not use floor polish or wax that makes floors slippery. If you must use wax, use non-skid floor wax.  Do not have throw rugs and other things on the floor that can make you trip. What can I do with my stairs?  Do not leave  any items on the stairs.  Make sure that there are handrails on both sides of the stairs and use them. Fix handrails that are broken or loose. Make sure that handrails are as long as the stairways.  Check any carpeting to make sure that it is firmly attached to the stairs. Fix any carpet that is loose or worn.  Avoid having throw rugs at the top or bottom of the stairs. If you do have throw rugs, attach them to the floor with carpet tape.  Make sure that you have a light switch at the top of the stairs and the bottom of the stairs. If you do not have them, ask someone to add them for you. What else can I do to help prevent falls?  Wear shoes that:  Do not have high heels.  Have rubber bottoms.  Are comfortable and fit you well.  Are closed at the toe. Do not wear sandals.  If you use a stepladder:  Make sure that it is fully opened. Do not climb a closed stepladder.  Make sure that both sides of the stepladder are locked into place.  Ask  someone to hold it for you, if possible.  Clearly mark and make sure that you can see:  Any grab bars or handrails.  First and last steps.  Where the edge of each step is.  Use tools that help you move around (mobility aids) if they are needed. These include:  Canes.  Walkers.  Scooters.  Crutches.  Turn on the lights when you go into a dark area. Replace any light bulbs as soon as they burn out.  Set up your furniture so you have a clear path. Avoid moving your furniture around.  If any of your floors are uneven, fix them.  If there are any pets around you, be aware of where they are.  Review your medicines with your doctor. Some medicines can make you feel dizzy. This can increase your chance of falling. Ask your doctor what other things that you can do to help prevent falls. This information is not intended to replace advice given to you by your health care provider. Make sure you discuss any questions you have with your health care provider. Document Released: 12/21/2008 Document Revised: 08/02/2015 Document Reviewed: 03/31/2014 Elsevier Interactive Patient Education  2017 [employer].

## 2020-02-07 NOTE — Assessment & Plan Note (Signed)
Longstanding and slight worsening No red flags Continue prune juice/fiber as this is helping Discussed return precautions

## 2020-02-07 NOTE — Assessment & Plan Note (Signed)
Longstanding and intermittent issue Discussed staying active Tylenol arthritis as needed Consider imaging if worsens

## 2020-02-07 NOTE — Assessment & Plan Note (Signed)
Previously well controlled Continue statin Repeat FLP and CMP Goal LDL < 70 

## 2020-02-07 NOTE — Patient Instructions (Signed)

## 2020-02-07 NOTE — Assessment & Plan Note (Signed)
Noted on CT chest Continue statin Asymptomatic

## 2020-02-07 NOTE — Assessment & Plan Note (Signed)
Noted on CT chest Continues to have shortness of breath daily with activity and at rest Occasional wheezing as well Start Spiriva daily Continue albuterol as needed If develops any chest pain or not improving, consider cardiac work-up

## 2020-02-07 NOTE — Assessment & Plan Note (Signed)
Discussed importance of healthy weight management Discussed diet and exercise  

## 2020-02-07 NOTE — Assessment & Plan Note (Signed)
Well controlled Continue current medications Recheck metabolic panel F/u in 6 months  

## 2020-02-07 NOTE — Progress Notes (Signed)
Complete physical exam   Patient: Isaac Watkins   DOB: 1940/07/28   79 y.o. Male  MRN: [id number] Visit Date: 02/07/2020  Today's healthcare provider: Lavon Paganini, MD   Chief Complaint  Patient presents with  . Annual Exam   Subjective    Isaac Watkins is a 79 y.o. male who presents today for a complete physical exam.  He reports consuming a general diet. Home exercise routine includes stretching. He generally feels well. He reports sleeping fairly well. He does have additional problems to discuss today.  HPI  Patient C/O shortness of breath, joint pain and constipation.  Constipation x6 months. Dulcolax prn. Prune juice prn. BM q4-5 days without supplement. With prune juice q1-2 days and softer. More frequent of a problem than previously. No blood in stool.   Joint pain in bilateral hips. Better with walking more. Pain when first starting moving, Ongoing for years. Lateral hips. Saw Ortho in [place] and told he had spinal stenosis. No numbness or weakness in legs  SOB is chronic, but worsening.  Albuterol before exercise, not doing as well as before.  Past Medical History:  Diagnosis Date  . Allergic rhinitis   . Hyperlipidemia   . Hypertension    Past Surgical History:  Procedure Laterality Date  . CARPAL TUNNEL RELEASE Left   . CATARACT EXTRACTION    . TONSILECTOMY, ADENOIDECTOMY, BILATERAL MYRINGOTOMY AND TUBES     Social History   Socioeconomic History  . Marital status: Married    Spouse name: Not on file  . Number of children: 3  . Years of education: Not on file  . Highest education level: Master's degree (e.g., MA, MS, MEng, MEd, MSW, MBA)  Occupational History  . Occupation: retired    Comment: owned Midwife  Tobacco Use  . Smoking status: Former Smoker    Packs/day: 1.00    Years: 40.00    Pack years: 40.00    Types: Cigarettes    Quit date: 08/18/2011    Years since quitting: 8.4  . Smokeless tobacco: Never  Used  Vaping Use  . Vaping Use: Never used  Substance and Sexual Activity  . Alcohol use: Yes    Alcohol/week: 21.0 standard drinks    Types: 21 Shots of liquor per week    Comment: 3 whiskey drinks a day  . Drug use: Never  . Sexual activity: Yes    Partners: Female    Birth control/protection: None  Other Topics Concern  . Not on file  Social History Narrative  . Not on file   Social Determinants of Health   Financial Resource Strain: Low Risk   . Difficulty of Paying Living Expenses: Not hard at all  Food Insecurity: No Food Insecurity  . Worried About Charity fundraiser in the Last Year: Never true  . Ran Out of Food in the Last Year: Never true  Transportation Needs: No Transportation Needs  . Lack of Transportation (Medical): No  . Lack of Transportation (Non-Medical): No  Physical Activity: Inactive  . Days of Exercise per Week: 0 days  . Minutes of Exercise per Session: 0 min  Stress: No Stress Concern Present  . Feeling of Stress : Not at all  Social Connections: Moderately Isolated  . Frequency of Communication with Friends and Family: More than three times a week  . Frequency of Social Gatherings with Friends and Family: More than three times a week  . Attends Religious Services: Never  .  Active Member of Clubs or Organizations: No  . Attends Archivist Meetings: Never  . Marital Status: Married  Human resources officer Violence: Not At Risk  . Fear of Current or Ex-Partner: No  . Emotionally Abused: No  . Physically Abused: No  . Sexually Abused: No   Family Status  Relation Name Status  . Father  Deceased  . Mother  Deceased  . Sister  Alive  . PGF  (Not Specified)  . Daughter  Alive  . Son  Alive  . Daughter  Alive  . Neg Hx  (Not Specified)   Family History  Problem Relation Age of Onset  . Pancreatic cancer Father   . Pancreatic disease Father   . COPD Father   . Dementia Mother   . Breast cancer Sister        BRCA 2 +  . Brain  cancer Paternal Grandfather   . Heart disease Son   . Heart attack Son   . Prostate cancer Neg Hx   . Bladder Cancer Neg Hx   . Kidney cancer Neg Hx    No Known Allergies  Patient Care Team: Virginia Crews, MD as PCP - General (Family Medicine) Ralene Bathe, MD (Dermatology) Lorelee Cover., MD (Ophthalmology)   Medications: Outpatient Medications Prior to Visit  Medication Sig  . albuterol (VENTOLIN HFA) 108 (90 Base) MCG/ACT inhaler Inhale 2 puffs into the lungs every 6 (six) hours as needed for wheezing or shortness of breath.  Marland Kitchen atorvastatin (LIPITOR) 40 MG tablet Take 1 tablet (40 mg total) by mouth daily at 6 PM.  . citalopram (CELEXA) 20 MG tablet Take 1 tablet (20 mg total) by mouth daily.  Marland Kitchen diltiazem (TIAZAC) 300 MG 24 hr capsule Take 1 capsule (300 mg total) by mouth daily.  . hydrochlorothiazide (HYDRODIURIL) 25 MG tablet Take 1 tablet (25 mg total) by mouth daily.  Marland Kitchen losartan (COZAAR) 100 MG tablet Take 1 tablet (100 mg total) by mouth daily.  . Magnesium Hydroxide (DULCOLAX PO) Take 2 tablets by mouth as needed.   No facility-administered medications prior to visit.    Review of Systems  HENT: Positive for sneezing.   Respiratory: Positive for shortness of breath.   Gastrointestinal: Positive for constipation.  Endocrine: Positive for polyuria.  Musculoskeletal: Positive for arthralgias.    Last CBC Lab Results  Component Value Date   WBC 6.5 01/27/2019   HGB 13.0 01/27/2019   HCT 37.3 (L) 01/27/2019   MCV 95 01/27/2019   MCH 33.2 (H) 01/27/2019   RDW 11.8 01/27/2019   PLT 289 36/64/4034   Last metabolic panel Lab Results  Component Value Date   GLUCOSE 86 07/27/2019   NA 136 07/27/2019   K 4.2 07/27/2019   CL 99 07/27/2019   CO2 22 07/27/2019   BUN 22 07/27/2019   CREATININE 1.13 07/27/2019   GFRNONAA 63 07/27/2019   GFRAA 73 07/27/2019   CALCIUM 9.5 07/27/2019   PROT 6.9 01/27/2019   ALBUMIN 4.5 01/27/2019   LABGLOB 2.4  01/27/2019   AGRATIO 1.9 01/27/2019   BILITOT 0.5 01/27/2019   ALKPHOS 100 01/27/2019   AST 24 01/27/2019   ALT 22 01/27/2019   Last lipids Lab Results  Component Value Date   CHOL 134 01/27/2019   HDL 52 01/27/2019   LDLCALC 62 01/27/2019   TRIG 108 01/27/2019   CHOLHDL 2.6 01/27/2019   Last hemoglobin A1c No results found for: HGBA1C Last thyroid functions Lab Results  Component  Value Date   TSH 2.97 03/05/2018   Last vitamin D No results found for: 25OHVITD2, 25OHVITD3, VD25OH Last vitamin B12 and Folate Lab Results  Component Value Date   VITAMINB12 242.2 03/05/2018      Objective    BP 112/62 (BP Location: Right Arm, Patient Position: Sitting, Cuff Size: Large)   Pulse 64   Temp (!) 97.5 F (36.4 C) (Oral)   Resp 16   Ht 6' (1.829 m)   Wt 236 lb (107 kg)   SpO2 96%   BMI 32.01 kg/m  BP Readings from Last 3 Encounters:  02/07/20 112/62  02/07/20 112/62  12/23/19 118/61   Wt Readings from Last 3 Encounters:  02/07/20 236 lb (107 kg)  02/07/20 236 lb (107 kg)  12/23/19 231 lb 6.4 oz (105 kg)      Physical Exam Vitals reviewed.  Constitutional:      General: He is not in acute distress.    Appearance: Normal appearance. He is well-developed. He is not diaphoretic.  HENT:     Head: Normocephalic and atraumatic.     Right Ear: Tympanic membrane, ear canal and external ear normal.     Left Ear: Tympanic membrane, ear canal and external ear normal.  Eyes:     General: No scleral icterus.    Conjunctiva/sclera: Conjunctivae normal.     Pupils: Pupils are equal, round, and reactive to light.  Neck:     Thyroid: No thyromegaly.  Cardiovascular:     Rate and Rhythm: Normal rate and regular rhythm.     Pulses: Normal pulses.     Heart sounds: Normal heart sounds. No murmur heard.   Pulmonary:     Effort: Pulmonary effort is normal. No respiratory distress.     Breath sounds: Normal breath sounds. No wheezing or rales.  Abdominal:     General:  There is no distension.     Palpations: Abdomen is soft.     Tenderness: There is no abdominal tenderness.  Musculoskeletal:        General: No deformity.     Cervical back: Neck supple.     Right lower leg: No edema.     Left lower leg: No edema.     Comments: Hip ROM intact. No TTP over bony landmarks  Lymphadenopathy:     Cervical: No cervical adenopathy.  Skin:    General: Skin is warm and dry.     Findings: No rash.  Neurological:     Mental Status: He is alert and oriented to person, place, and time. Mental status is at baseline.     Sensory: No sensory deficit.     Motor: No weakness.     Gait: Gait normal.  Psychiatric:        Mood and Affect: Mood normal.        Behavior: Behavior normal.        Thought Content: Thought content normal.       Last depression screening scores PHQ 2/9 Scores 02/07/2020 01/25/2019 09/20/2018  PHQ - 2 Score 0 0 0  PHQ- 9 Score - - 0   Last fall risk screening Fall Risk  02/07/2020  Falls in the past year? 0  Number falls in past yr: 0  Injury with Fall? 0   Last Audit-C alcohol use screening Alcohol Use Disorder Test (AUDIT) 02/07/2020  1. How often do you have a drink containing alcohol? 4  2. How many drinks containing alcohol do you have on a typical  day when you are drinking? 1  3. How often do you have six or more drinks on one occasion? 0  AUDIT-C Score 5  4. How often during the last year have you found that you were not able to stop drinking once you had started? 0  5. How often during the last year have you failed to do what was normally expected from you because of drinking? 0  6. How often during the last year have you needed a first drink in the morning to get yourself going after a heavy drinking session? 0  7. How often during the last year have you had a feeling of guilt of remorse after drinking? 0  8. How often during the last year have you been unable to remember what happened the night before because you had been  drinking? 0  9. Have you or someone else been injured as a result of your drinking? 0  10. Has a relative or friend or a doctor or another health worker been concerned about your drinking or suggested you cut down? 0  Alcohol Use Disorder Identification Test Final Score (AUDIT) 5  Alcohol Brief Interventions/Follow-up AUDIT Score <7 follow-up not indicated   A score of 3 or more in women, and 4 or more in men indicates increased risk for alcohol abuse, EXCEPT if all of the points are from question 1   No results found for any visits on 02/07/20.  Assessment & Plan    Routine Health Maintenance and Physical Exam  Exercise Activities and Dietary recommendations Goals    . DIET - INCREASE WATER INTAKE     Recommend to drink at least 6-8 8oz glasses of water per day.       Immunization History  Administered Date(s) Administered  . Fluad Quad(high Dose 65+) 01/26/2019  . Hepatitis A, Adult 01/13/2011, 07/10/2011  . Influenza, High Dose Seasonal PF 01/07/2020  . PFIZER SARS-COV-2 Vaccination 03/21/2019, 04/11/2019  . Pneumococcal Conjugate-13 10/20/2013  . Pneumococcal Polysaccharide-23 02/08/2009, 11/22/2014, 01/10/2018  . Tdap 01/03/2016  . Zoster 08/12/2008  . Zoster Recombinat (Shingrix) 03/19/2017, 08/20/2017    Health Maintenance  Topic Date Due  . TETANUS/TDAP  01/02/2026  . INFLUENZA VACCINE  Completed  . COVID-19 Vaccine  Completed  . PNA vac Low Risk Adult  Completed    Discussed health benefits of physical activity, and encouraged him to engage in regular exercise appropriate for his age and condition.  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[facility])    Noted on CT chest Continues to have  shortness of breath daily with activity and at rest Occasional wheezing as well Start Spiriva daily Continue albuterol as needed If develops any chest pain or not improving, consider cardiac work-up      Relevant Medications   Tiotropium Bromide Monohydrate (SPIRIVA RESPIMAT) 2.5 MCG/ACT AERS     Musculoskeletal and Integument   Primary osteoarthritis of both hips    Longstanding and intermittent issue Discussed staying active Tylenol arthritis as needed Consider imaging if worsens        Other   Obesity    Discussed importance of healthy weight management Discussed diet and exercise  Relevant Orders   Comprehensive metabolic panel   Lipid Panel With LDL/HDL Ratio   Pure hypercholesterolemia    Previously well controlled Continue statin Repeat FLP and CMP Goal LDL < 70      Relevant Orders   Comprehensive metabolic panel   Lipid Panel With LDL/HDL Ratio   Constipation    Longstanding and slight worsening No red flags Continue prune juice/fiber as this is helping Discussed return precautions       Other Visit Diagnoses    Encounter for annual physical exam    -  Primary   Relevant Orders   Comprehensive metabolic panel   Lipid Panel With LDL/HDL Ratio       Return in about 6 months (around 08/06/2020) for chronic disease f/u.     I, Lavon Paganini, MD, have reviewed all documentation for this visit. The documentation on 02/07/20 for the exam, diagnosis, procedures, and orders are all accurate and complete.   Lamoine Magallon, Dionne Bucy, MD, MPH [facility]

## 2020-02-08 ENCOUNTER — Other Ambulatory Visit: Payer: Self-pay

## 2020-02-08 ENCOUNTER — Telehealth: Payer: Self-pay

## 2020-02-08 ENCOUNTER — Ambulatory Visit
Admission: RE | Admit: 2020-02-08 | Discharge: 2020-02-08 | Disposition: A | Discharge status: Home / Self Care | Payer: Medicare Other | Source: Ambulatory Visit | Attending: Nurse Practitioner | Admitting: Nurse Practitioner

## 2020-02-08 DIAGNOSIS — Z87891 Personal history of nicotine dependence: Secondary | ICD-10-CM | POA: Insufficient documentation

## 2020-02-08 DIAGNOSIS — Z122 Encounter for screening for malignant neoplasm of respiratory organs: Secondary | ICD-10-CM | POA: Diagnosis not present

## 2020-02-08 LAB — LIPID PANEL WITH LDL/HDL RATIO
Cholesterol, Total: 165 mg/dL (ref 100–199)
HDL: 63 mg/dL (ref >39)
LDL Chol Calc (NIH): 83 mg/dL (ref 0–99)
LDL/HDL Ratio: 1.3 ratio (ref 0.0–3.6)
Triglycerides: 106 mg/dL (ref 0–149)
VLDL Cholesterol Cal: 19 mg/dL (ref 5–40)

## 2020-02-08 LAB — COMPREHENSIVE METABOLIC PANEL
ALT: 23 IU/L (ref 0–44)
AST: 22 IU/L (ref 0–40)
Albumin/Globulin Ratio: 1.8 (ref 1.2–2.2)
Albumin: 4.6 g/dL (ref 3.7–4.7)
Alkaline Phosphatase: 101 IU/L (ref 44–121)
BUN/Creatinine Ratio: 24 (ref 10–24)
BUN: 27 mg/dL (ref 8–27)
Bilirubin Total: 0.7 mg/dL (ref 0.0–1.2)
CO2: 23 mmol/L (ref 20–29)
Calcium: 9.9 mg/dL (ref 8.6–10.2)
Chloride: 101 mmol/L (ref 96–106)
Creatinine, Ser: 1.14 mg/dL (ref 0.76–1.27)
GFR calc Af Amer: 72 mL/min/{1.73_m2} (ref >59)
GFR calc non Af Amer: 62 mL/min/{1.73_m2} (ref >59)
Globulin, Total: 2.5 g/dL (ref 1.5–4.5)
Glucose: 103 mg/dL — ABNORMAL HIGH (ref 65–99)
Potassium: 4.5 mmol/L (ref 3.5–5.2)
Sodium: 139 mmol/L (ref 134–144)
Total Protein: 7.1 g/dL (ref 6.0–8.5)

## 2020-02-08 NOTE — Telephone Encounter (Signed)
-----   Message from Erasmo Downer, MD sent at 02/08/2020 10:23 AM EST ----- Normal labs

## 2020-02-08 NOTE — Telephone Encounter (Signed)
Pt's wife [name] advised.  (per DPR)  Thanks,   -Vernona Rieger

## 2020-02-17 ENCOUNTER — Encounter: Payer: Self-pay | Admitting: *Deleted

## 2020-03-19 ENCOUNTER — Other Ambulatory Visit: Payer: Medicare Other

## 2020-03-19 DIAGNOSIS — Z20822 Contact with and (suspected) exposure to covid-19: Secondary | ICD-10-CM | POA: Diagnosis not present

## 2020-03-20 LAB — NOVEL CORONAVIRUS, NAA: SARS-CoV-2, NAA: NOT DETECTED

## 2020-03-20 LAB — SARS-COV-2, NAA 2 DAY TAT

## 2020-04-16 ENCOUNTER — Other Ambulatory Visit: Payer: Self-pay

## 2020-04-16 MED ORDER — HYDROCHLOROTHIAZIDE 25 MG PO TABS
25.0000 mg | ORAL_TABLET | Freq: Every day | ORAL | 0 refills | Status: DC
Start: 2020-04-16 — End: 2020-09-21

## 2020-04-16 MED ORDER — CITALOPRAM HYDROBROMIDE 20 MG PO TABS
20.0000 mg | ORAL_TABLET | Freq: Every day | ORAL | 0 refills | Status: DC
Start: 2020-04-16 — End: 2020-09-05

## 2020-04-16 MED ORDER — LOSARTAN POTASSIUM 100 MG PO TABS
100.0000 mg | ORAL_TABLET | Freq: Every day | ORAL | 0 refills | Status: DC
Start: 2020-04-16 — End: 2020-04-30

## 2020-04-16 MED ORDER — ATORVASTATIN CALCIUM 40 MG PO TABS
40.0000 mg | ORAL_TABLET | Freq: Every day | ORAL | 0 refills | Status: DC
Start: 2020-04-16 — End: 2020-09-05

## 2020-04-16 MED ORDER — SPIRIVA RESPIMAT 2.5 MCG/ACT IN AERS: 2.0000 | INHALATION_SPRAY | Freq: Every day | RESPIRATORY_TRACT | 5 refills | Status: DC

## 2020-04-16 MED ORDER — DILTIAZEM HCL ER BEADS 300 MG PO CP24
300.0000 mg | ORAL_CAPSULE | Freq: Every day | ORAL | 0 refills | Status: DC
Start: 2020-04-16 — End: 2020-09-21

## 2020-04-30 ENCOUNTER — Telehealth: Payer: Self-pay

## 2020-04-30 MED ORDER — LOSARTAN POTASSIUM 100 MG PO TABS
100.0000 mg | ORAL_TABLET | Freq: Every day | ORAL | 1 refills | Status: DC
Start: 2020-04-30 — End: 2020-09-21

## 2020-04-30 NOTE — Telephone Encounter (Signed)
[facility] faxed refill request for the following medications:  losartan (COZAAR) 100 MG tablet    Please advise.

## 2020-07-02 ENCOUNTER — Ambulatory Visit (INDEPENDENT_AMBULATORY_CARE_PROVIDER_SITE_OTHER): Payer: Medicare Other | Admitting: Family Medicine

## 2020-07-02 ENCOUNTER — Encounter: Payer: Self-pay | Admitting: Family Medicine

## 2020-07-02 ENCOUNTER — Other Ambulatory Visit: Payer: Self-pay

## 2020-07-02 VITALS — BP 131/81 | HR 76 | Temp 98.2°F | Resp 16 | Wt 241.0 lb

## 2020-07-02 DIAGNOSIS — S46012D Strain of muscle(s) and tendon(s) of the rotator cuff of left shoulder, subsequent encounter: Secondary | ICD-10-CM | POA: Diagnosis not present

## 2020-07-02 DIAGNOSIS — S46212D Strain of muscle, fascia and tendon of other parts of biceps, left arm, subsequent encounter: Secondary | ICD-10-CM

## 2020-07-02 NOTE — Progress Notes (Signed)
Established patient visit   Patient: Isaac Watkins   DOB: 05-Oct-1940   80 y.o. Male  MRN: [id number] Visit Date: 07/02/2020  Today's healthcare provider: Mila Merry, MD   Chief Complaint  Patient presents with  . Shoulder Pain   Subjective    Shoulder Pain  The pain is present in the left shoulder and left arm. This is a chronic problem. The current episode started more than 1 year ago. There has been a history of trauma (fell onto shoulder 03/2019 getting onto a boat). The problem occurs intermittently. The problem has been unchanged. The quality of the pain is described as aching. Associated symptoms include a limited range of motion and stiffness. Pertinent negatives include no fever, joint swelling, numbness or tingling. Exacerbated by: raising left arm. Treatments tried: cortisone injections. The treatment provided mild relief.    Patient had an MRI of the left shoulder last year on 06/09/2019 after initial injury in [place], showing partial articular surface tear of supraspinatus and tendinosis of infraspinatus, with small joint effusion and moderated subacriomial/subdeltoid bursal fluid. He states he was referred to physical therapy last year, but never went to to long wait. He also had three corticosteroid injections by Arther Abbott last year with mild but temporary relief.      Medications: Outpatient Medications Prior to Visit  Medication Sig  . albuterol (VENTOLIN HFA) 108 (90 Base) MCG/ACT inhaler Inhale 2 puffs into the lungs every 6 (six) hours as needed for wheezing or shortness of breath.  Marland Kitchen atorvastatin (LIPITOR) 40 MG tablet Take 1 tablet (40 mg total) by mouth daily at 6 PM.  . citalopram (CELEXA) 20 MG tablet Take 1 tablet (20 mg total) by mouth daily.  Marland Kitchen diltiazem (TIAZAC) 300 MG 24 hr capsule Take 1 capsule (300 mg total) by mouth daily.  . hydrochlorothiazide (HYDRODIURIL) 25 MG tablet Take 1 tablet (25 mg total) by mouth daily.  Marland Kitchen losartan  (COZAAR) 100 MG tablet Take 1 tablet (100 mg total) by mouth daily.  . Magnesium Hydroxide (DULCOLAX PO) Take 2 tablets by mouth as needed.  . Tiotropium Bromide Monohydrate (SPIRIVA RESPIMAT) 2.5 MCG/ACT AERS Inhale 2 puffs into the lungs daily.   No facility-administered medications prior to visit.    Review of Systems  Constitutional: Negative for appetite change, chills and fever.  Respiratory: Negative for chest tightness, shortness of breath and wheezing.   Cardiovascular: Negative for chest pain and palpitations.  Gastrointestinal: Negative for abdominal pain, nausea and vomiting.  Musculoskeletal: Positive for stiffness.  Neurological: Negative for tingling and numbness.       Objective    BP 131/81 (BP Location: Left Arm, Patient Position: Sitting, Cuff Size: Normal)   Pulse 76   Temp 98.2 F (36.8 C) (Temporal)   Resp 16   Wt 241 lb (109.3 kg)   BMI 32.69 kg/m       Assessment & Plan     1. Traumatic incomplete tear of left rotator cuff, subsequent encounter  - Ambulatory referral to Orthopedic Surgery  2. Tear of left biceps muscle, subsequent encounter  - Ambulatory referral to Orthopedic Surgery   Ongoing since early last year with minimal improvement after multiple corticosteroid injections        The entirety of the information documented in the History of Present Illness, Review of Systems and Physical Exam were personally obtained by me. Portions of this information were initially documented by the CMA and reviewed by me for thoroughness  and accuracy.      Lelon Huh, MD  [facility] [phone] (phone) [phone] (fax)  [place]

## 2020-07-05 ENCOUNTER — Ambulatory Visit: Payer: Medicare Other | Admitting: Family Medicine

## 2020-07-16 DIAGNOSIS — M75112 Incomplete rotator cuff tear or rupture of left shoulder, not specified as traumatic: Secondary | ICD-10-CM | POA: Diagnosis not present

## 2020-07-16 DIAGNOSIS — R52 Pain, unspecified: Secondary | ICD-10-CM | POA: Diagnosis not present

## 2020-08-03 DIAGNOSIS — M75112 Incomplete rotator cuff tear or rupture of left shoulder, not specified as traumatic: Secondary | ICD-10-CM | POA: Diagnosis not present

## 2020-08-09 ENCOUNTER — Encounter: Payer: Self-pay | Admitting: Family Medicine

## 2020-08-09 ENCOUNTER — Ambulatory Visit (INDEPENDENT_AMBULATORY_CARE_PROVIDER_SITE_OTHER): Payer: Medicare Other | Admitting: Family Medicine

## 2020-08-09 VITALS — BP 121/70 | HR 74 | Temp 98.5°F | Resp 16 | Wt 237.8 lb

## 2020-08-09 DIAGNOSIS — J439 Emphysema, unspecified: Secondary | ICD-10-CM

## 2020-08-09 DIAGNOSIS — E669 Obesity, unspecified: Secondary | ICD-10-CM | POA: Diagnosis not present

## 2020-08-09 DIAGNOSIS — I10 Essential (primary) hypertension: Secondary | ICD-10-CM

## 2020-08-09 DIAGNOSIS — E7800 Pure hypercholesterolemia, unspecified: Secondary | ICD-10-CM

## 2020-08-09 DIAGNOSIS — Z6832 Body mass index (BMI) 32.0-32.9, adult: Secondary | ICD-10-CM

## 2020-08-09 DIAGNOSIS — I700 Atherosclerosis of aorta: Secondary | ICD-10-CM

## 2020-08-09 NOTE — Assessment & Plan Note (Signed)
Well controlled Continue current medications Recheck metabolic panel F/u in 6 months  

## 2020-08-09 NOTE — Assessment & Plan Note (Signed)
Chronic and well controlled conitnue controller inhaler

## 2020-08-09 NOTE — Patient Instructions (Signed)
High Cholesterol  High cholesterol is a condition in which the blood has high levels of a white, waxy substance similar to fat (cholesterol). The liver makes all the cholesterol that the body needs. The human body needs small amounts of cholesterol to help build cells. A person gets extra or excess cholesterol from the food that he or she eats. The blood carries cholesterol from the liver to the rest of the body. If you have high cholesterol, deposits (plaques) may build up on the walls of your arteries. Arteries are the blood vessels that carry blood away from your heart. These plaques make the arteries narrow and stiff. Cholesterol plaques increase your risk for heart attack and stroke. Work with your health care provider to keep your cholesterol levels in a healthy range. What increases the risk? The following factors may make you more likely to develop this condition:  Eating foods that are high in animal fat (saturated fat) or cholesterol.  Being overweight.  Not getting enough exercise.  A family history of high cholesterol (familial hypercholesterolemia).  Use of tobacco products.  Having diabetes. What are the signs or symptoms? There are no symptoms of this condition. How is this diagnosed? This condition may be diagnosed based on the results of a blood test.  If you are older than 78 years of age, your health care provider may check your cholesterol levels every 4-6 years.  You may be checked more often if you have high cholesterol or other risk factors for heart disease. The blood test for cholesterol measures:  "Bad" cholesterol, or LDL cholesterol. This is the main type of cholesterol that causes heart disease. The desired level is less than 100 mg/dL.  "Good" cholesterol, or HDL cholesterol. HDL helps protect against heart disease by cleaning the arteries and carrying the LDL to the liver for processing. The desired level for HDL is 60 mg/dL or higher.  Triglycerides.  These are fats that your body can store or burn for energy. The desired level is less than 150 mg/dL.  Total cholesterol. This measures the total amount of cholesterol in your blood and includes LDL, HDL, and triglycerides. The desired level is less than 200 mg/dL. How is this treated? This condition may be treated with:  Diet changes. You may be asked to eat foods that have more fiber and less saturated fats or added sugar.  Lifestyle changes. These may include regular exercise, maintaining a healthy weight, and quitting use of tobacco products.  Medicines. These are given when diet and lifestyle changes have not worked. You may be prescribed a statin medicine to help lower your cholesterol levels. Follow these instructions at home: Eating and drinking  Eat a healthy, balanced diet. This diet includes: ? Daily servings of a variety of fresh, frozen, or canned fruits and vegetables. ? Daily servings of whole grain foods that are rich in fiber. ? Foods that are low in saturated fats and trans fats. These include poultry and fish without skin, lean cuts of meat, and low-fat dairy products. ? A variety of fish, especially oily fish that contain omega-3 fatty acids. Aim to eat fish at least 2 times a week.  Avoid foods and drinks that have added sugar.  Use healthy cooking methods, such as roasting, grilling, broiling, baking, poaching, steaming, and stir-frying. Do not fry your food except for stir-frying.   Lifestyle  Get regular exercise. Aim to exercise for a total of 150 minutes a week. Increase your activity level by doing activities   such as gardening, walking, and taking the stairs.  Do not use any products that contain nicotine or tobacco, such as cigarettes, e-cigarettes, and chewing tobacco. If you need help quitting, ask your health care provider.   General instructions  Take over-the-counter and prescription medicines only as told by your health care provider.  Keep all  follow-up visits as told by your health care provider. This is important. Where to find more information  American Heart Association: www.heart.org  National Heart, Lung, and Blood Institute: www.nhlbi.nih.gov Contact a health care provider if:  You have trouble achieving or maintaining a healthy diet or weight.  You are starting an exercise program.  You are unable to stop smoking. Get help right away if:  You have chest pain.  You have trouble breathing.  You have any symptoms of a stroke. "BE FAST" is an easy way to remember the main warning signs of a stroke: ? B - Balance. Signs are dizziness, sudden trouble walking, or loss of balance. ? E - Eyes. Signs are trouble seeing or a sudden change in vision. ? F - Face. Signs are sudden weakness or numbness of the face, or the face or eyelid drooping on one side. ? A - Arms. Signs are weakness or numbness in an arm. This happens suddenly and usually on one side of the body. ? S - Speech. Signs are sudden trouble speaking, slurred speech, or trouble understanding what people say. ? T - Time. Time to call emergency services. Write down what time symptoms started.  You have other signs of a stroke, such as: ? A sudden, severe headache with no known cause. ? Nausea or vomiting. ? Seizure. These symptoms may represent a serious problem that is an emergency. Do not wait to see if the symptoms will go away. Get medical help right away. Call your local emergency services (911 in the [place] Do not drive yourself to the hospital. Summary  Cholesterol plaques increase your risk for heart attack and stroke. Work with your health care provider to keep your cholesterol levels in a healthy range.  Eat a healthy, balanced diet, get regular exercise, and maintain a healthy weight.  Do not use any products that contain nicotine or tobacco, such as cigarettes, e-cigarettes, and chewing tobacco.  Get help right away if you have any symptoms of a  stroke. This information is not intended to replace advice given to you by your health care provider. Make sure you discuss any questions you have with your health care provider. Document Revised: 01/24/2019 Document Reviewed: 01/24/2019 Elsevier Patient Education  2021 [employer]. Hypertension, Adult High blood pressure (hypertension) is when the force of blood pumping through the arteries is too strong. The arteries are the blood vessels that carry blood from the heart throughout the body. Hypertension forces the heart to work harder to pump blood and may cause arteries to become narrow or stiff. Untreated or uncontrolled hypertension can cause a heart attack, heart failure, a stroke, kidney disease, and other problems. A blood pressure reading consists of a higher number over a lower number. Ideally, your blood pressure should be below 120/80. The first ("top") number is called the systolic pressure. It is a measure of the pressure in your arteries as your heart beats. The second ("bottom") number is called the diastolic pressure. It is a measure of the pressure in your arteries as the heart relaxes. What are the causes? The exact cause of this condition is not known. There are some   conditions that result in or are related to high blood pressure. What increases the risk? Some risk factors for high blood pressure are under your control. The following factors may make you more likely to develop this condition:  Smoking.  Having type 2 diabetes mellitus, high cholesterol, or both.  Not getting enough exercise or physical activity.  Being overweight.  Having too much fat, sugar, calories, or salt (sodium) in your diet.  Drinking too much alcohol. Some risk factors for high blood pressure may be difficult or impossible to change. Some of these factors include:  Having chronic kidney disease.  Having a family history of high blood pressure.  Age. Risk increases with age.  Race. You may  be at higher risk if you are African American.  Gender. Men are at higher risk than women before age 45. After age 65, women are at higher risk than men.  Having obstructive sleep apnea.  Stress. What are the signs or symptoms? High blood pressure may not cause symptoms. Very high blood pressure (hypertensive crisis) may cause:  Headache.  Anxiety.  Shortness of breath.  Nosebleed.  Nausea and vomiting.  Vision changes.  Severe chest pain.  Seizures. How is this diagnosed? This condition is diagnosed by measuring your blood pressure while you are seated, with your arm resting on a flat surface, your legs uncrossed, and your feet flat on the floor. The cuff of the blood pressure monitor will be placed directly against the skin of your upper arm at the level of your heart. It should be measured at least twice using the same arm. Certain conditions can cause a difference in blood pressure between your right and left arms. Certain factors can cause blood pressure readings to be lower or higher than normal for a short period of time:  When your blood pressure is higher when you are in a health care provider's office than when you are at home, this is called white coat hypertension. Most people with this condition do not need medicines.  When your blood pressure is higher at home than when you are in a health care provider's office, this is called masked hypertension. Most people with this condition may need medicines to control blood pressure. If you have a high blood pressure reading during one visit or you have normal blood pressure with other risk factors, you may be asked to:  Return on a different day to have your blood pressure checked again.  Monitor your blood pressure at home for 1 week or longer. If you are diagnosed with hypertension, you may have other blood or imaging tests to help your health care provider understand your overall risk for other conditions. How is this  treated? This condition is treated by making healthy lifestyle changes, such as eating healthy foods, exercising more, and reducing your alcohol intake. Your health care provider may prescribe medicine if lifestyle changes are not enough to get your blood pressure under control, and if:  Your systolic blood pressure is above 130.  Your diastolic blood pressure is above 80. Your personal target blood pressure may vary depending on your medical conditions, your age, and other factors. Follow these instructions at home: Eating and drinking  Eat a diet that is high in fiber and potassium, and low in sodium, added sugar, and fat. An example eating plan is called the DASH (Dietary Approaches to Stop Hypertension) diet. To eat this way: ? Eat plenty of fresh fruits and vegetables. Try to fill one half   of your plate at each meal with fruits and vegetables. ? Eat whole grains, such as whole-wheat pasta, brown rice, or whole-grain bread. Fill about one fourth of your plate with whole grains. ? Eat or drink low-fat dairy products, such as skim milk or low-fat yogurt. ? Avoid fatty cuts of meat, processed or cured meats, and poultry with skin. Fill about one fourth of your plate with lean proteins, such as fish, chicken without skin, beans, eggs, or tofu. ? Avoid pre-made and processed foods. These tend to be higher in sodium, added sugar, and fat.  Reduce your daily sodium intake. Most people with hypertension should eat less than 1,500 mg of sodium a day.  Do not drink alcohol if: ? Your health care provider tells you not to drink. ? You are pregnant, may be pregnant, or are planning to become pregnant.  If you drink alcohol: ? Limit how much you use to:  0-1 drink a day for women.  0-2 drinks a day for men. ? Be aware of how much alcohol is in your drink. In the [place] one drink equals one 12 oz bottle of beer (355 mL), one 5 oz glass of wine (148 mL), or one 1 oz glass of hard liquor (44 mL).    Lifestyle  Work with your health care provider to maintain a healthy body weight or to lose weight. Ask what an ideal weight is for you.  Get at least 30 minutes of exercise most days of the week. Activities may include walking, swimming, or biking.  Include exercise to strengthen your muscles (resistance exercise), such as Pilates or lifting weights, as part of your weekly exercise routine. Try to do these types of exercises for 30 minutes at least 3 days a week.  Do not use any products that contain nicotine or tobacco, such as cigarettes, e-cigarettes, and chewing tobacco. If you need help quitting, ask your health care provider.  Monitor your blood pressure at home as told by your health care provider.  Keep all follow-up visits as told by your health care provider. This is important.   Medicines  Take over-the-counter and prescription medicines only as told by your health care provider. Follow directions carefully. Blood pressure medicines must be taken as prescribed.  Do not skip doses of blood pressure medicine. Doing this puts you at risk for problems and can make the medicine less effective.  Ask your health care provider about side effects or reactions to medicines that you should watch for. Contact a health care provider if you:  Think you are having a reaction to a medicine you are taking.  Have headaches that keep coming back (recurring).  Feel dizzy.  Have swelling in your ankles.  Have trouble with your vision. Get help right away if you:  Develop a severe headache or confusion.  Have unusual weakness or numbness.  Feel faint.  Have severe pain in your chest or abdomen.  Vomit repeatedly.  Have trouble breathing. Summary  Hypertension is when the force of blood pumping through your arteries is too strong. If this condition is not controlled, it may put you at risk for serious complications.  Your personal target blood pressure may vary depending on  your medical conditions, your age, and other factors. For most people, a normal blood pressure is less than 120/80.  Hypertension is treated with lifestyle changes, medicines, or a combination of both. Lifestyle changes include losing weight, eating a healthy, low-sodium diet, exercising more, and limiting alcohol.  This information is not intended to replace advice given to you by your health care provider. Make sure you discuss any questions you have with your health care provider. Document Revised: 11/04/2017 Document Reviewed: 11/04/2017 Elsevier Patient Education  2021 [employer].

## 2020-08-09 NOTE — Progress Notes (Signed)
Established patient visit   Patient: Isaac Watkins   DOB: [birth year]   80 y.o. Male  MRN: [id number] Visit Date: 08/09/2020  Today's healthcare provider: Shirlee Latch, MD   Chief Complaint  Patient presents with  . Hypertension  . Hyperlipidemia   Subjective    Hypertension Pertinent negatives include no chest pain, headaches, neck pain, palpitations or shortness of breath.  Hyperlipidemia Pertinent negatives include no chest pain or shortness of breath.    Vaccines He has received all COVID vaccines.   Hypertension, follow-up  BP Readings from Last 3 Encounters:  08/09/20 121/70  07/02/20 131/81  02/07/20 112/62   Wt Readings from Last 3 Encounters:  08/09/20 237 lb 12.8 oz (107.9 kg)  07/02/20 241 lb (109.3 kg)  02/08/20 234 lb (106.1 kg)     He was last seen for hypertension 6 months ago.  BP at that visit was 112/62. Management since that visit includes no changes.  He reports excellent compliance with treatment. He is not having side effects.  He is following a Regular diet. He is not exercising. He does not smoke. He is tolerating 40mg  of Atorvastatin.   Use of agents associated with hypertension: none.   Outside blood pressures are not being checked. Symptoms: No chest pain No chest pressure  No palpitations No syncope  Yes dyspnea No orthopnea  No paroxysmal nocturnal dyspnea No lower extremity edema   Pertinent labs: Lab Results  Component Value Date   CHOL 165 02/07/2020   HDL 63 02/07/2020   LDLCALC 83 02/07/2020   TRIG 106 02/07/2020   CHOLHDL 2.6 01/27/2019   Lab Results  Component Value Date   NA 139 02/07/2020   K 4.5 02/07/2020   CREATININE 1.14 02/07/2020   GFRNONAA 62 02/07/2020   GFRAA 72 02/07/2020   GLUCOSE 103 (H) 02/07/2020     The 10-year ASCVD risk score 02/09/2020 DC Jr., et al., 2013) is: 26.3%    --------------------------------------------------------------------------------------------------- Lipid/Cholesterol, Follow-up  Last lipid panel Other pertinent labs  Lab Results  Component Value Date   CHOL 165 02/07/2020   HDL 63 02/07/2020   LDLCALC 83 02/07/2020   TRIG 106 02/07/2020   CHOLHDL 2.6 01/27/2019   Lab Results  Component Value Date   ALT 23 02/07/2020   AST 22 02/07/2020   PLT 289 01/27/2019   TSH 2.97 03/05/2018     He was last seen for this 6 months ago.  Management since that visit includes no changes.  He reports excellent compliance with treatment. He is not having side effects.   Symptoms: No chest pain No chest pressure/discomfort  No dyspnea No lower extremity edema  No numbness or tingling of extremity No orthopnea  No palpitations No paroxysmal nocturnal dyspnea  No speech difficulty No syncope   Current diet: in general, a "healthy" diet   Current exercise: no regular exercise  The 10-year ASCVD risk score 03/07/2018 DC Denman George., et al., 2013) is: 26.3%  ---------------------------------------------------------------------------------------------------   Patient Active Problem List   Diagnosis Date Noted  . Primary osteoarthritis of both hips 02/07/2020  . Constipation 02/07/2020  . Pure hypercholesterolemia 07/27/2019  . Pulmonary emphysema (HCC) 07/27/2019  . Aortic atherosclerosis (HCC) 07/27/2019  . Bursitis of left shoulder 06/30/2019  . Nontraumatic incomplete tear of left rotator cuff 06/30/2019  . Obesity 01/26/2019  . Hypertension 09/20/2018  . Adjustment disorder 09/20/2018  . BPH associated with nocturia 09/20/2018  . Seasonal allergic rhinitis due to pollen 09/20/2018  Social History   Tobacco Use  . Smoking status: Former Smoker    Packs/day: 1.00    Years: 40.00    Pack years: 40.00    Types: Cigarettes    Quit date: 08/18/2011    Years since quitting: 8.9  . Smokeless tobacco: Never Used  Vaping Use  . Vaping  Use: Never used  Substance Use Topics  . Alcohol use: Yes    Alcohol/week: 21.0 standard drinks    Types: 21 Shots of liquor per week    Comment: 3 whiskey drinks a day  . Drug use: Never   No Known Allergies     Medications: Outpatient Medications Prior to Visit  Medication Sig Note  . atorvastatin (LIPITOR) 40 MG tablet Take 1 tablet (40 mg total) by mouth daily at 6 PM.   . citalopram (CELEXA) 20 MG tablet Take 1 tablet (20 mg total) by mouth daily.   Marland Kitchen diltiazem (TIAZAC) 300 MG 24 hr capsule Take 1 capsule (300 mg total) by mouth daily.   . hydrochlorothiazide (HYDRODIURIL) 25 MG tablet Take 1 tablet (25 mg total) by mouth daily.   Marland Kitchen losartan (COZAAR) 100 MG tablet Take 1 tablet (100 mg total) by mouth daily.   . meloxicam (MOBIC) 15 MG tablet Take 1 tablet by mouth daily.   . Tiotropium Bromide Monohydrate (SPIRIVA RESPIMAT) 2.5 MCG/ACT AERS Inhale 2 puffs into the lungs daily.   . [DISCONTINUED] albuterol (VENTOLIN HFA) 108 (90 Base) MCG/ACT inhaler Inhale 2 puffs into the lungs every 6 (six) hours as needed for wheezing or shortness of breath. (Patient not taking: Reported on 08/09/2020) 08/09/2020: PRN   No facility-administered medications prior to visit.    Review of Systems  Constitutional: Negative for activity change, appetite change, chills, fatigue and fever.  HENT: Negative for ear pain, nosebleeds, sinus pressure, sinus pain and sore throat.   Eyes: Negative for pain.  Respiratory: Negative for cough, chest tightness, shortness of breath and wheezing.   Cardiovascular: Negative for chest pain, palpitations and leg swelling.  Gastrointestinal: Negative for abdominal pain, blood in stool, diarrhea, nausea and vomiting.  Genitourinary: Negative for flank pain, frequency and urgency.  Musculoskeletal: Negative for back pain, neck pain and neck stiffness.  Neurological: Negative for dizziness, syncope, weakness, light-headedness, numbness and headaches.       Objective     BP 121/70   Pulse 74   Temp 98.5 F (36.9 C) (Oral)   Resp 16   Wt 237 lb 12.8 oz (107.9 kg)   SpO2 98%   BMI 32.25 kg/m  BP Readings from Last 3 Encounters:  08/09/20 121/70  07/02/20 131/81  02/07/20 112/62   Wt Readings from Last 3 Encounters:  08/09/20 237 lb 12.8 oz (107.9 kg)  07/02/20 241 lb (109.3 kg)  02/08/20 234 lb (106.1 kg)       Physical Exam Vitals reviewed.  Constitutional:      General: He is not in acute distress.    Appearance: Normal appearance. He is not diaphoretic.  HENT:     Head: Normocephalic and atraumatic.  Eyes:     General: No scleral icterus.    Conjunctiva/sclera: Conjunctivae normal.  Cardiovascular:     Rate and Rhythm: Normal rate and regular rhythm.     Pulses: Normal pulses.     Heart sounds: Normal heart sounds. No murmur heard.   Pulmonary:     Effort: Pulmonary effort is normal. No respiratory distress.     Breath sounds: Normal breath  sounds. No wheezing or rhonchi.  Abdominal:     General: There is no distension.     Palpations: Abdomen is soft.     Tenderness: There is no abdominal tenderness.  Musculoskeletal:     Cervical back: Neck supple.     Right lower leg: No edema.     Left lower leg: No edema.  Lymphadenopathy:     Cervical: No cervical adenopathy.  Skin:    General: Skin is warm and dry.     Capillary Refill: Capillary refill takes less than 2 seconds.     Findings: No rash.  Neurological:     Mental Status: He is alert and oriented to person, place, and time.     Cranial Nerves: No cranial nerve deficit.  Psychiatric:        Mood and Affect: Mood normal.        Behavior: Behavior normal.       No results found for any visits on 08/09/20.  Assessment & Plan     Problem List Items Addressed This Visit      Cardiovascular and Mediastinum   Hypertension - Primary    Well controlled Continue current medications Recheck metabolic panel F/u in 6 months       Relevant Orders   Basic  Metabolic Panel (BMET)   Aortic atherosclerosis (HCC)    Continue statin        Respiratory   Pulmonary emphysema (HCC)    Chronic and well controlled conitnue controller inhaler        Other   Obesity    Discussed importance of healthy weight management Discussed diet and exercise       Pure hypercholesterolemia    Reviewed last lipids - well controlled Continue statin Recheck FLP at CPE in 1m          Return in about 6 months (around 02/08/2021) for AWV, CPE, as scheduled.       I,Essence Turner,acting as a Neurosurgeon for Shirlee Latch, MD.,have documented all relevant documentation on the behalf of Shirlee Latch, MD,as directed by  Shirlee Latch, MD while in the presence of Shirlee Latch, MD.  I, Shirlee Latch, MD, have reviewed all documentation for this visit. The documentation on 08/09/20 for the exam, diagnosis, procedures, and orders are all accurate and complete.   Tymira Horkey, Marzella Schlein, MD, MPH [facility]

## 2020-08-09 NOTE — Assessment & Plan Note (Signed)
Discussed importance of healthy weight management Discussed diet and exercise  

## 2020-08-09 NOTE — Assessment & Plan Note (Signed)
Reviewed last lipids - well controlled Continue statin Recheck FLP at CPE in 10m

## 2020-08-09 NOTE — Assessment & Plan Note (Signed)
Continue statin. 

## 2020-08-10 LAB — BASIC METABOLIC PANEL
BUN/Creatinine Ratio: 20 (ref 10–24)
BUN: 24 mg/dL (ref 8–27)
CO2: 20 mmol/L (ref 20–29)
Calcium: 9.4 mg/dL (ref 8.6–10.2)
Chloride: 101 mmol/L (ref 96–106)
Creatinine, Ser: 1.21 mg/dL (ref 0.76–1.27)
Glucose: 98 mg/dL (ref 65–99)
Potassium: 3.9 mmol/L (ref 3.5–5.2)
Sodium: 138 mmol/L (ref 134–144)
eGFR: 62 mL/min/{1.73_m2} (ref >59)

## 2020-09-05 ENCOUNTER — Other Ambulatory Visit: Payer: Self-pay | Admitting: Family Medicine

## 2020-09-19 ENCOUNTER — Other Ambulatory Visit: Payer: Self-pay | Admitting: Family Medicine

## 2020-09-19 DIAGNOSIS — H524 Presbyopia: Secondary | ICD-10-CM | POA: Diagnosis not present

## 2020-09-19 NOTE — Telephone Encounter (Signed)
[facility] faxed refill request for the following medications:  TIADYLT ER (300 MG) CAPSULES (24HR)  TAKE 1 CAPSULE (300 MG) BY MOUTH DAILY    We have never prescript this medication above !  metoprolol succinate (TOPROL-XL) 50 MG 24 hr tablet   hydrochlorothiazide (HYDRODIURIL) 25 MG tablet   Please advise.

## 2020-09-21 ENCOUNTER — Telehealth: Payer: Self-pay

## 2020-09-21 MED ORDER — LOSARTAN POTASSIUM 100 MG PO TABS
100.0000 mg | ORAL_TABLET | Freq: Every day | ORAL | 1 refills | Status: DC
Start: 2020-09-21 — End: 2021-04-30

## 2020-09-21 MED ORDER — HYDROCHLOROTHIAZIDE 25 MG PO TABS
25.0000 mg | ORAL_TABLET | Freq: Every day | ORAL | 0 refills | Status: DC
Start: 2020-09-21 — End: 2020-12-19

## 2020-09-21 MED ORDER — DILTIAZEM HCL ER BEADS 300 MG PO CP24
300.0000 mg | ORAL_CAPSULE | Freq: Every day | ORAL | 0 refills | Status: DC
Start: 2020-09-21 — End: 2020-12-16

## 2020-09-21 NOTE — Telephone Encounter (Signed)
Copied from CRM [id number]. Topic: Quick Communication - Rx Refill/Question >> Sep 21, 2020 12:39 PM Pawlus, Maxine Glenn A wrote: Pt stated that [facility] has faxed over refill requests a few times but have not heard back, please advise if you received these faxes, pt wanted to know when his meds will be filled due to going out of town tomorrow.

## 2020-09-21 NOTE — Telephone Encounter (Signed)
Reviewed medications with patient. refills sent as per last office visit, patient advised to Continue current medications.

## 2020-10-09 ENCOUNTER — Encounter: Payer: Self-pay | Admitting: Family Medicine

## 2020-10-09 ENCOUNTER — Other Ambulatory Visit: Payer: Self-pay

## 2020-10-09 ENCOUNTER — Ambulatory Visit (INDEPENDENT_AMBULATORY_CARE_PROVIDER_SITE_OTHER): Payer: Medicare Other | Admitting: Family Medicine

## 2020-10-09 VITALS — BP 118/68 | HR 69 | Temp 98.9°F | Wt 239.0 lb

## 2020-10-09 DIAGNOSIS — H8111 Benign paroxysmal vertigo, right ear: Secondary | ICD-10-CM | POA: Diagnosis not present

## 2020-10-09 MED ORDER — MECLIZINE HCL 25 MG PO TABS: 25.0000 mg | ORAL_TABLET | Freq: Three times a day (TID) | ORAL | 0 refills | Status: DC | PRN

## 2020-10-09 NOTE — Progress Notes (Signed)
Established patient visit   Patient: Isaac Watkins   DOB: [birth year]   80 y.o. Male  MRN: [id number] Visit Date: 10/09/2020  Today's healthcare provider: Shirlee Latch, MD   Chief Complaint  Patient presents with   Dizziness   Subjective    Dizziness This is a new problem. The current episode started more than 1 month ago. The problem occurs intermittently. The problem has been gradually worsening. Pertinent negatives include no abdominal pain, chest pain, chills, coughing, fatigue, fever, headaches, myalgias, nausea, neck pain, numbness, sore throat, vomiting or weakness.    Vertigo  He frequents the restroom throughout the day and when he gets up he feels the dizziness. Additionally he feels like the room after sitting for extended periods of time and then stands. He has dizziness when he turns his head quickly.   He denies having falls, syncopal episodes and hearing loss associated with this. He has normal hearing loss aside from vertigo.      Medications: Outpatient Medications Prior to Visit  Medication Sig   atorvastatin (LIPITOR) 40 MG tablet TAKE 1 TABLET(40 MG) BY MOUTH DAILY AT 6 PM   citalopram (CELEXA) 20 MG tablet TAKE 1 TABLET(20 MG) BY MOUTH DAILY   diltiazem (TIAZAC) 300 MG 24 hr capsule Take 1 capsule (300 mg total) by mouth daily.   hydrochlorothiazide (HYDRODIURIL) 25 MG tablet Take 1 tablet (25 mg total) by mouth daily.   meloxicam (MOBIC) 15 MG tablet Take 1 tablet by mouth daily.   Tiotropium Bromide Monohydrate (SPIRIVA RESPIMAT) 2.5 MCG/ACT AERS Inhale 2 puffs into the lungs daily.   losartan (COZAAR) 100 MG tablet Take 1 tablet (100 mg total) by mouth daily.   No facility-administered medications prior to visit.   Review of Systems  Constitutional:  Negative for chills, fatigue and fever.  HENT:  Negative for ear pain, sinus pressure, sinus pain and sore throat.   Eyes:  Negative for pain and visual disturbance.  Respiratory:   Negative for cough, chest tightness, shortness of breath and wheezing.   Cardiovascular:  Negative for chest pain, palpitations and leg swelling.  Gastrointestinal:  Negative for abdominal pain, blood in stool, diarrhea, nausea and vomiting.  Genitourinary:  Negative for dysuria, flank pain, frequency and urgency.  Musculoskeletal:  Negative for back pain, myalgias and neck pain.  Neurological:  Positive for dizziness. Negative for seizures, syncope, weakness, light-headedness, numbness and headaches.      Objective    BP 118/68   Pulse 69   Temp 98.9 F (37.2 C)   Wt 239 lb (108.4 kg)   BMI 32.41 kg/m     Physical Exam Vitals reviewed.  Constitutional:      General: He is not in acute distress.    Appearance: Normal appearance. He is not diaphoretic.  HENT:     Head: Normocephalic and atraumatic.  Eyes:     General: No scleral icterus.    Extraocular Movements:     Right eye: Nystagmus present.     Conjunctiva/sclera: Conjunctivae normal.  Cardiovascular:     Rate and Rhythm: Normal rate and regular rhythm.     Pulses: Normal pulses.     Heart sounds: Normal heart sounds. No murmur heard. Pulmonary:     Effort: Pulmonary effort is normal. No respiratory distress.     Breath sounds: Normal breath sounds. No wheezing or rhonchi.  Abdominal:     General: There is no distension.     Palpations: Abdomen is soft.  Tenderness: There is no abdominal tenderness.  Musculoskeletal:     Cervical back: Neck supple.     Right lower leg: No edema.     Left lower leg: No edema.  Lymphadenopathy:     Cervical: No cervical adenopathy.  Skin:    General: Skin is warm and dry.     Capillary Refill: Capillary refill takes less than 2 seconds.     Findings: No rash.  Neurological:     Mental Status: He is alert and oriented to person, place, and time.     Cranial Nerves: No cranial nerve deficit.  Psychiatric:        Mood and Affect: Mood normal.        Behavior: Behavior  normal.     No results found for any visits on 10/09/20.  Assessment & Plan     Problem List Items Addressed This Visit   None Visit Diagnoses     Benign paroxysmal positional vertigo of right ear    -  Primary     - new problem - neuro intact wihtout signs of stroke - no hearing loss, so low risk of menieres disease - advised Epley maneuver and meclizine prn - use caution with changing positions.  Total time spent on today's visit was greater than 30 minutes, including both face-to-face time and nonface-to-face time personally spent on review of chart (labs and imaging), discussing labs and goals, discussing further work-up, treatment options, referrals to specialist if needed, reviewing outside records of pertinent, answering patient's questions, and coordinating care.    Return if symptoms worsen or fail to improve.      I,Essence Turner,acting as a Neurosurgeon for Shirlee Latch, MD.,have documented all relevant documentation on the behalf of Shirlee Latch, MD,as directed by  Shirlee Latch, MD while in the presence of Shirlee Latch, MD.  I, Shirlee Latch, MD, have reviewed all documentation for this visit. The documentation on 10/09/20 for the exam, diagnosis, procedures, and orders are all accurate and complete.   Charlotta Lapaglia, Marzella Schlein, MD, MPH [facility]

## 2020-11-30 ENCOUNTER — Other Ambulatory Visit: Payer: Self-pay | Admitting: Family Medicine

## 2020-12-04 IMAGING — CT CT CHEST LUNG CANCER SCREENING LOW DOSE W/O CM
2 of 5 series · 15 of 40 positions shown, 18 images · non-contrast
Comparison: 02/08/2019

CLINICAL DATA: 78-year-old male with 40 pack-year history of
smoking. Lung cancer screening.

EXAM:
CT CHEST WITHOUT CONTRAST LOW-DOSE FOR LUNG CANCER SCREENING
TECHNIQUE: Multidetector CT imaging of the chest was performed following the
standard protocol without IV contrast.

[Series 3: lung 1.00 · axial · 0.80mm/px · z∈[-1313,-955]mm · 12 of 396 slices shown, 15 images]
[im 19/396  mediastinal]
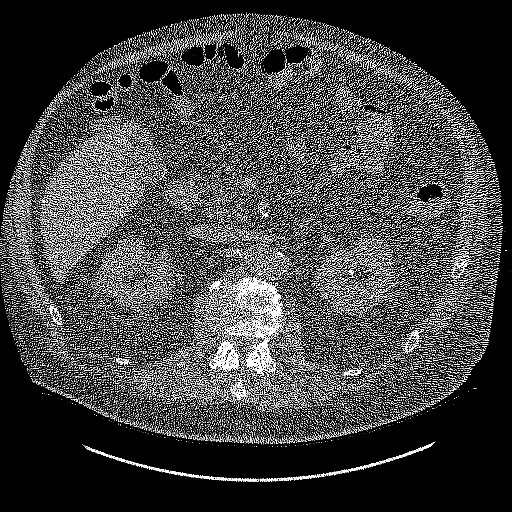
[im 19/396  lung]
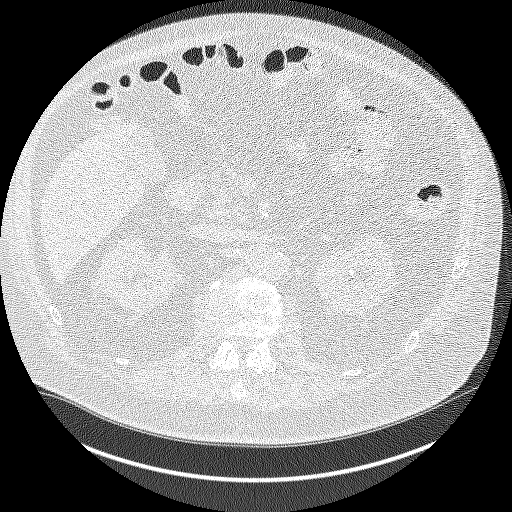
[im 57/396  lung]
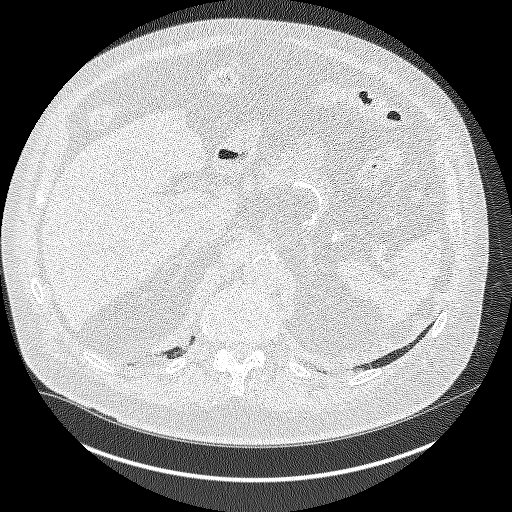
[im 95/396  lung]
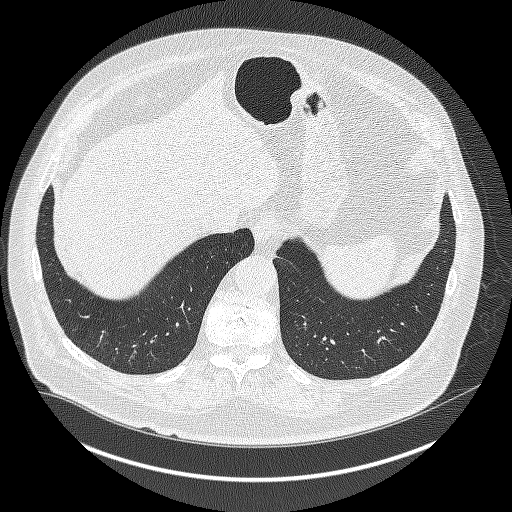
[im 113/396  lung]
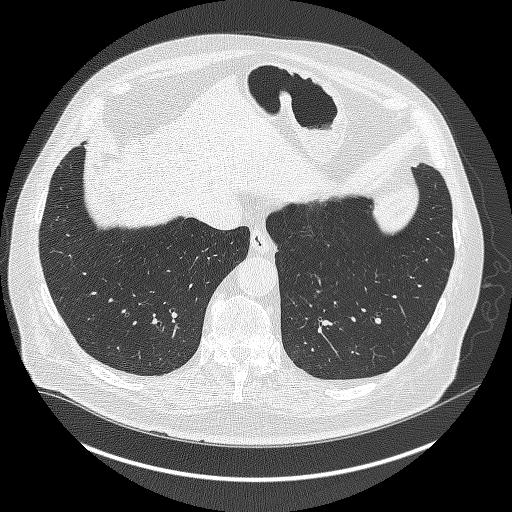
[im 151/396  mediastinal]
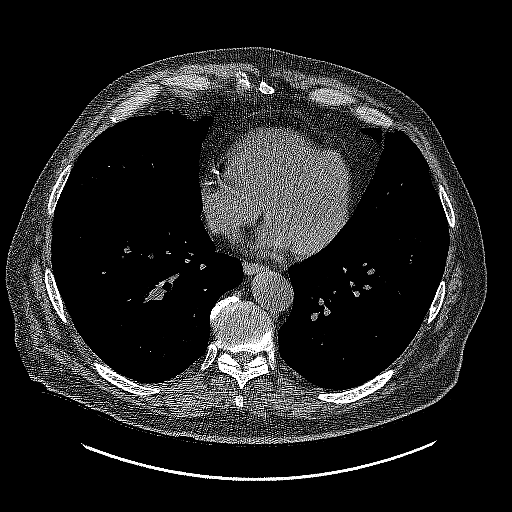
[im 151/396  lung]
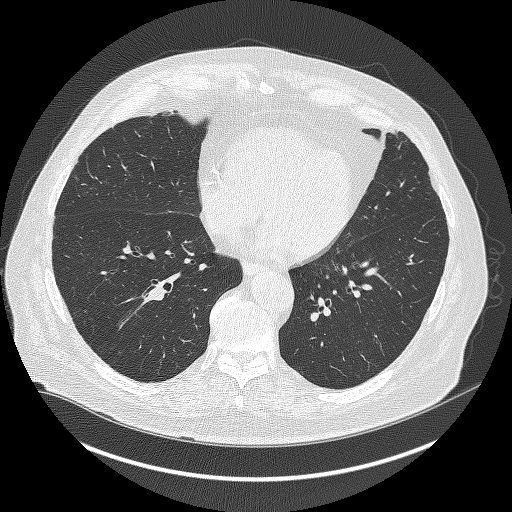
[im 189/396  lung]
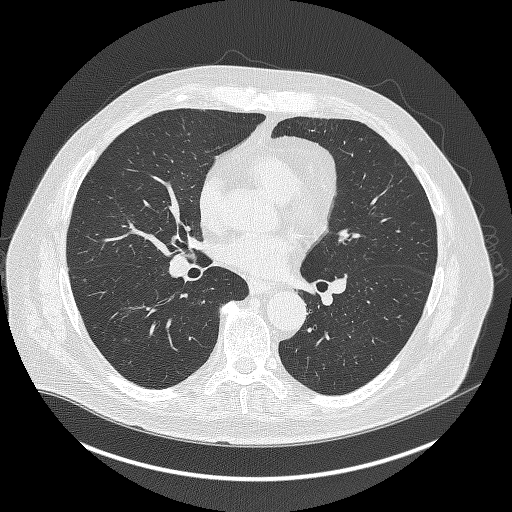
[im 207/396  lung]
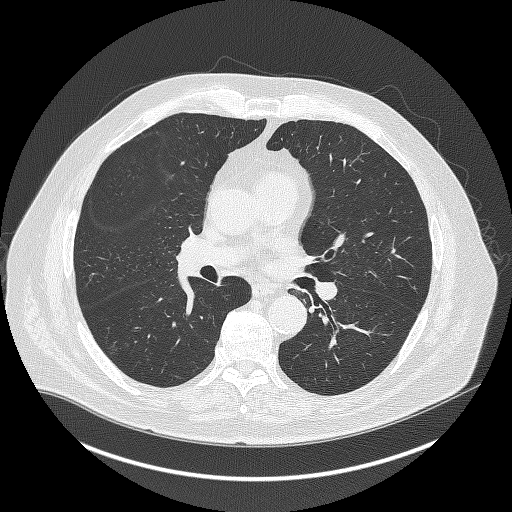
[im 245/396  lung]
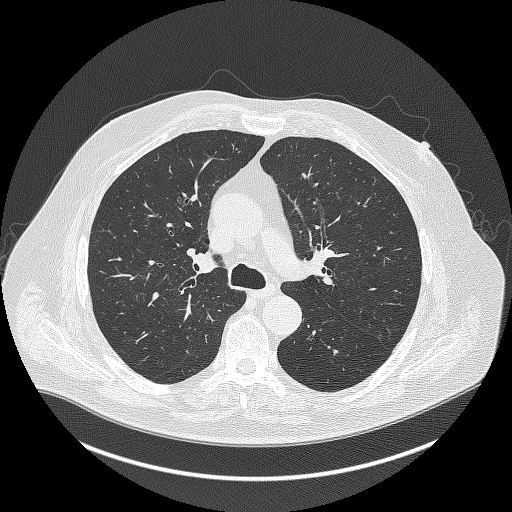
[im 283/396  mediastinal]
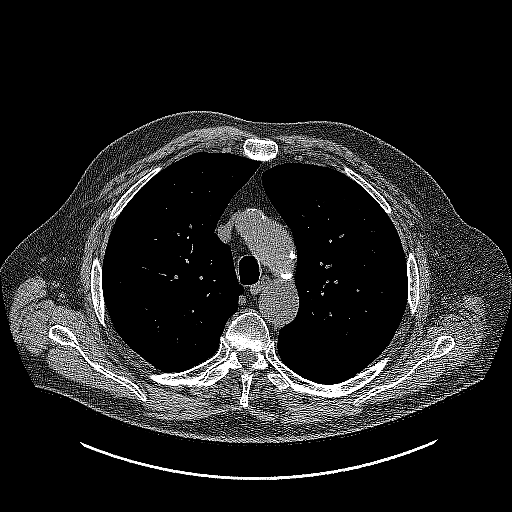
[im 283/396  lung]
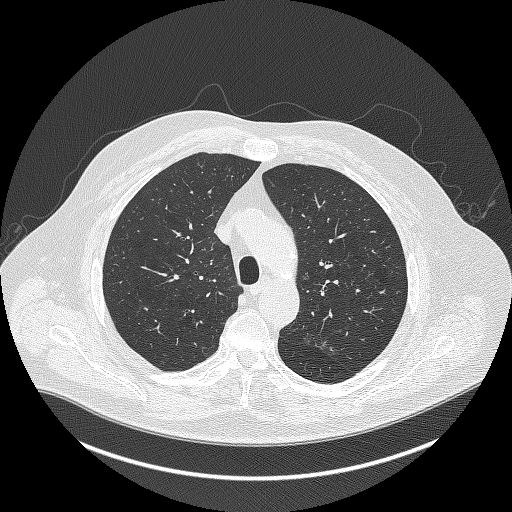
[im 301/396  lung]
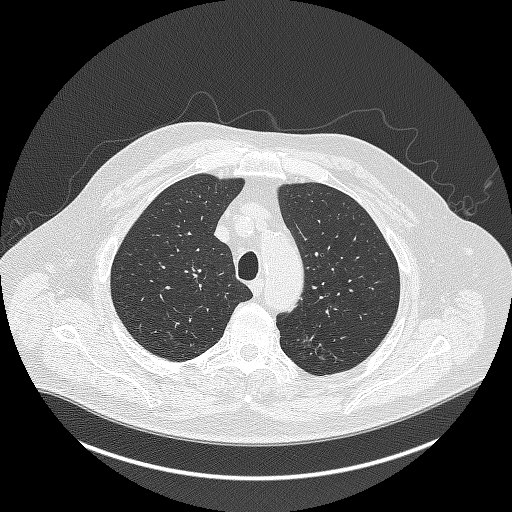
[im 339/396  lung]
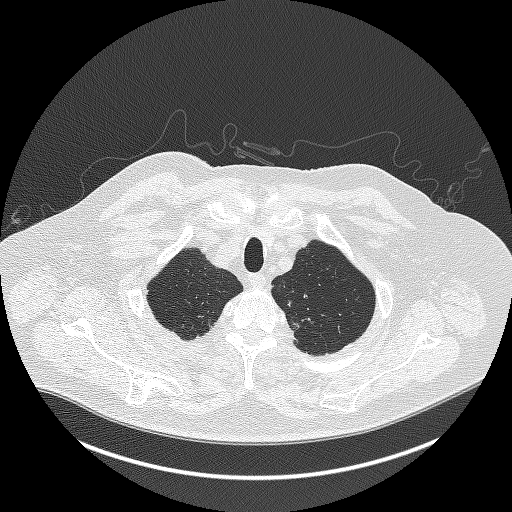
[im 377/396  lung]
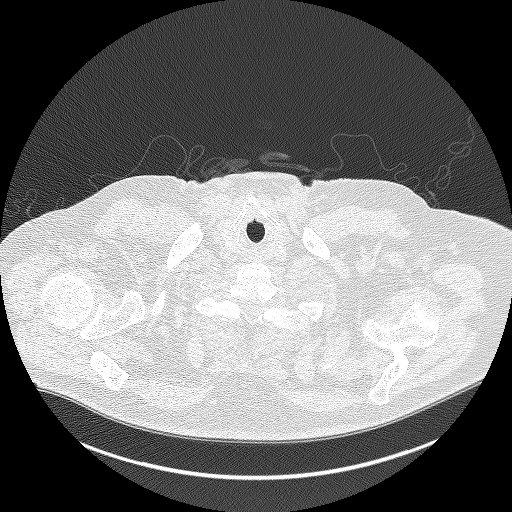

[Series 4: coronals lung 1.00 cor · coronal · 0.77mm/px · 3 of 399 slices shown]
[im 80/399  lung]
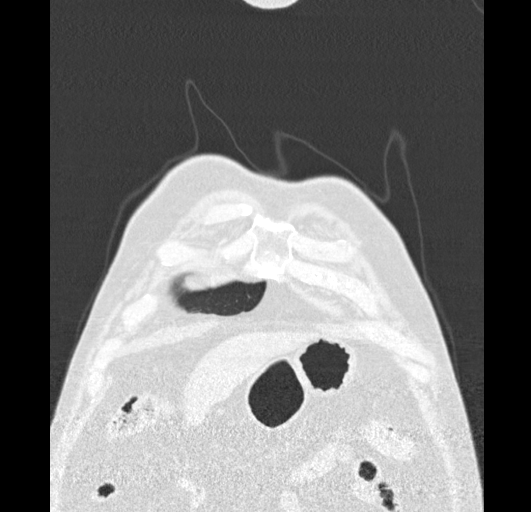
[im 160/399  lung]
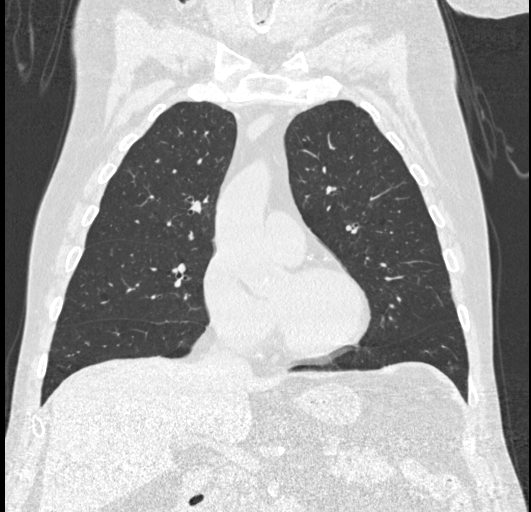
[im 239/399  lung]
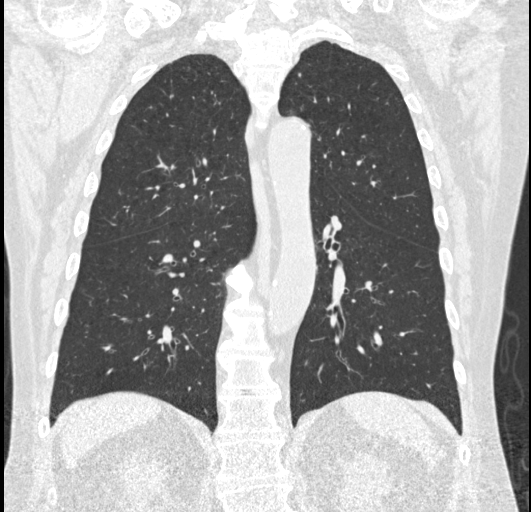

[15 of 40 positions shown; findings below may reference images not displayed]

FINDINGS: Cardiovascular: The heart size is normal. No substantial pericardial
effusion. Coronary artery calcification is evident. Atherosclerotic
calcification is noted in the wall of the thoracic aorta.

Mediastinum/Nodes: No mediastinal lymphadenopathy. No evidence for
gross hilar lymphadenopathy although assessment is limited by the
lack of intravenous contrast on today's study. The esophagus has
normal imaging features. There is no axillary lymphadenopathy.

Lungs/Pleura: Centrilobular emphsyema noted. Scattered tiny
bilateral pulmonary nodules identified previously are stable in the
interval, including the regions of tree-in-bud nodularity seen
medially and posteriorly in the left upper lobe. No suspicious
pulmonary nodule or mass. No focal airspace consolidation. No
pleural effusion.

Upper Abdomen: 2.3 cm cystic lesion identified at the junction of
the pancreatic body and tail (69/2) stable in the interval.

Musculoskeletal: No worrisome lytic or sclerotic osseous
abnormality.
IMPRESSION: 1. Lung-RADS 2, benign appearance or behavior. Continue annual
screening with low-dose chest CT without contrast in 12 months.
2. Similar appearance of tree-in-bud nodularity in the medial and
posterior left upper lobe, likely sequelae or scarring of chronic
atypical infection
3.  Emphysema (GVMCU-JU0.F) and Aortic Atherosclerosis (GVMCU-170.0)

## 2020-12-16 ENCOUNTER — Other Ambulatory Visit: Payer: Self-pay | Admitting: Family Medicine

## 2020-12-16 NOTE — Telephone Encounter (Signed)
Requested Prescriptions  Pending Prescriptions Disp Refills  . TIADYLT ER 300 MG 24 hr capsule [Pharmacy Med Name: TIADYLT ER 300MG  CAPSULES (24 HR)] 90 capsule 0    Sig: TAKE 1 CAPSULE(300 MG) BY MOUTH DAILY     Cardiovascular:  Calcium Channel Blockers Passed - 12/16/2020  6:36 AM      Passed - Last BP in normal range    BP Readings from Last 1 Encounters:  10/09/20 118/68         Passed - Valid encounter within last 6 months    Recent Outpatient Visits          2 months ago Benign paroxysmal positional vertigo of right ear   [facility] [place], [place], [place]   4 months ago Primary hypertension   [facility] [place], [place], MD   5 months ago Traumatic incomplete tear of left rotator cuff, subsequent encounter   [facility], MD   10 months ago Encounter for annual physical exam   [facility] [place], [place], MD   11 months ago Traumatic incomplete tear of left rotator cuff, subsequent encounter   [facility] [place], [place], Alessandra Bevels      Future Appointments            In 1 month Bacigalupo, [place], MD [facility]

## 2020-12-18 ENCOUNTER — Other Ambulatory Visit: Payer: Self-pay | Admitting: Family Medicine

## 2020-12-19 NOTE — Telephone Encounter (Signed)
Requested Prescriptions  Pending Prescriptions Disp Refills  . hydrochlorothiazide (HYDRODIURIL) 25 MG tablet [Pharmacy Med Name: HYDROCHLOROTHIAZIDE 25MG  TABLETS] 90 tablet 0    Sig: TAKE 1 TABLET(25 MG) BY MOUTH DAILY     Cardiovascular: Diuretics - Thiazide Passed - 12/18/2020  5:11 PM      Passed - Ca in normal range and within 360 days    Calcium  Date Value Ref Range Status  08/09/2020 9.4 8.6 - 10.2 mg/dL Final  10/09/2020 9.8  Final         Passed - Cr in normal range and within 360 days    Creatinine, Ser  Date Value Ref Range Status  08/09/2020 1.21 0.76 - 1.27 mg/dL Final         Passed - K in normal range and within 360 days    Potassium  Date Value Ref Range Status  08/09/2020 3.9 3.5 - 5.2 mmol/L Final         Passed - Na in normal range and within 360 days    Sodium  Date Value Ref Range Status  08/09/2020 138 134 - 144 mmol/L Final         Passed - Last BP in normal range    BP Readings from Last 1 Encounters:  10/09/20 118/68         Passed - Valid encounter within last 6 months    Recent Outpatient Visits          2 months ago Benign paroxysmal positional vertigo of right ear   [facility] [place], [place], MD   4 months ago Primary hypertension   [facility] [place], [place], MD   5 months ago Traumatic incomplete tear of left rotator cuff, subsequent encounter   [facility], MD   10 months ago Encounter for annual physical exam   [facility] [place], [place], [place]   12 months ago Traumatic incomplete tear of left rotator cuff, subsequent encounter   [facility] [place], [place], Alessandra Bevels      Future Appointments            In 1 month Bacigalupo, [place], MD [facility]

## 2020-12-28 ENCOUNTER — Ambulatory Visit: Payer: Self-pay

## 2020-12-28 NOTE — Telephone Encounter (Signed)
Noted  

## 2020-12-28 NOTE — Telephone Encounter (Signed)
Pt wife called in saying that pt has been having right leg pain for 5 days that has now moved up to his groin. Wife says he doesn't have any other symptoms except back pain but he has had back pain for years. Pt is taking tylenol for pain but isn't that effective. Pain level is 7-8/10 Wife wants him seen in office next Monday d/t being out of town until tomorrow. Advised available appts are too far out and called into office spoke with Okey Regal, [facility] who confirmed appts are too far out and to recommend UC. Advised wife and pt of no availability in timely manner and would need to be seen at [facility] for evaluation. Both stated they would find an UC in the location they were in or go to UC when they got back into [place] tomorrow. Care advice given and verbalized understanding.   Reason for Disposition  [1] MODERATE pain (e.g., interferes with normal activities, limping) AND [2] present > 3 days  Answer Assessment - Initial Assessment Questions 1. ONSET: "When did the pain start?"      5 days ago  2. LOCATION: "Where is the pain located?"      Right leg and groin  3. PAIN: "How bad is the pain?"    (Scale 1-10; or mild, moderate, severe)   -  MILD (1-3): doesn't interfere with normal activities    -  MODERATE (4-7): interferes with normal activities (e.g., work or school) or awakens from sleep, limping    -  SEVERE (8-10): excruciating pain, unable to do any normal activities, unable to walk     7-8 4. WORK OR EXERCISE: "Has there been any recent work or exercise that involved this part of the body?"      no 5. CAUSE: "What do you think is causing the leg pain?"     unsure 6. OTHER SYMPTOMS: "Do you have any other symptoms?" (e.g., chest pain, back pain, breathing difficulty, swelling, rash, fever, numbness, weakness)     Back pain but that's been going on  7. PREGNANCY: "Is there any chance you are pregnant?" "When was your last menstrual period?"     N/a  Protocols used: Leg Pain-A-AH

## 2020-12-31 ENCOUNTER — Encounter: Payer: Self-pay | Admitting: Emergency Medicine

## 2020-12-31 ENCOUNTER — Ambulatory Visit
Admission: EM | Admit: 2020-12-31 | Discharge: 2020-12-31 | Disposition: A | Discharge status: Home / Self Care | Payer: Medicare Other | Attending: Emergency Medicine | Admitting: Emergency Medicine

## 2020-12-31 ENCOUNTER — Other Ambulatory Visit: Payer: Self-pay

## 2020-12-31 DIAGNOSIS — M25559 Pain in unspecified hip: Secondary | ICD-10-CM

## 2020-12-31 DIAGNOSIS — M25551 Pain in right hip: Secondary | ICD-10-CM

## 2020-12-31 MED ORDER — MELOXICAM 7.5 MG PO TABS: 7.5000 mg | ORAL_TABLET | Freq: Every day | ORAL | 1 refills | Status: AC

## 2020-12-31 NOTE — ED Triage Notes (Signed)
Pt c/o right groin pain that radiates down right side x 1 week. Pt denies any injury.

## 2020-12-31 NOTE — Discharge Instructions (Signed)
Take Meloxicam once daily for pain and inflammation.  If symptoms do not improve, please make follow up appointment with ortho or return for prednisone prescription.

## 2020-12-31 NOTE — ED Provider Notes (Signed)
[facility]  ____________________________________________  Time seen: Approximately 8:55 AM  I have reviewed the triage vital signs and the nursing notes.   HISTORY  Chief Complaint Groin Pain   Historian Patient     HPI Isaac Watkins is a 80 y.o. male presents to the urgent care with right hip pain.  Patient localizes the pain to the right groin and along the right lateral aspect of the hip.  Pain does not radiate down the posterior aspect of his right leg and patient has been able to ambulate easily.  He denies falls or mechanisms of trauma.  No bowel or bladder incontinence or saddle anesthesia.  He denies fever, chills or weight loss.  He has been taking Tylenol for pain.   Past Medical History:  Diagnosis Date   Allergic rhinitis    Hyperlipidemia    Hypertension      Immunizations up to date:  Yes.     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   Constipation 02/07/2020   Pure hypercholesterolemia 07/27/2019   Pulmonary emphysema ([place]) 07/27/2019   Aortic atherosclerosis ([place]) 07/27/2019   Bursitis of left shoulder 06/30/2019   Nontraumatic incomplete tear of left rotator cuff 06/30/2019   Obesity 01/26/2019   Hypertension 09/20/2018   Adjustment disorder 09/20/2018   BPH associated with nocturia 09/20/2018   Seasonal allergic rhinitis due to pollen 09/20/2018    Past Surgical History:  Procedure Laterality Date   CARPAL TUNNEL RELEASE Left    CATARACT EXTRACTION     TONSILECTOMY, ADENOIDECTOMY, BILATERAL MYRINGOTOMY AND TUBES      Prior to Admission medications   Medication Sig Start Date End Date Taking? Authorizing Provider  atorvastatin (LIPITOR) 40 MG tablet TAKE 1 TABLET(40 MG) BY MOUTH DAILY AT 6 PM 11/30/20  Yes Bacigalupo, Dionne Bucy, MD  citalopram (CELEXA) 20 MG tablet  TAKE 1 TABLET(20 MG) BY MOUTH DAILY 11/30/20  Yes Bacigalupo, Dionne Bucy, MD  hydrochlorothiazide (HYDRODIURIL) 25 MG tablet TAKE 1 TABLET(25 MG) BY MOUTH DAILY 12/19/20  Yes Bacigalupo, Dionne Bucy, MD  losartan (COZAAR) 100 MG tablet Take 1 tablet (100 mg total) by mouth daily. 09/21/20  Yes Bacigalupo, Dionne Bucy, MD  meclizine (ANTIVERT) 25 MG tablet Take 1 tablet (25 mg total) by mouth 3 (three) times daily as needed for dizziness. 10/09/20  Yes Bacigalupo, Dionne Bucy, MD  meloxicam (MOBIC) 7.5 MG tablet Take 1 tablet (7.5 mg total) by mouth daily for 7 days. 12/31/20 01/07/21 Yes Vallarie Mare M, PA-C  TIADYLT ER 300 MG 24 hr capsule TAKE 1 CAPSULE(300 MG) BY MOUTH DAILY 12/16/20  Yes Bacigalupo, Dionne Bucy, MD  Tiotropium Bromide Monohydrate (SPIRIVA RESPIMAT) 2.5 MCG/ACT AERS Inhale 2 puffs into the lungs daily. 04/16/20  Yes Bacigalupo, Dionne Bucy, MD    Allergies Patient has no known allergies.  Family History  Problem Relation Age of Onset   Pancreatic cancer Father    Pancreatic disease Father    COPD Father    Dementia Mother    Breast cancer Sister        BRCA 2 +   Brain cancer Paternal Grandfather    Heart disease Son    Heart attack Son    Prostate cancer Neg Hx    Bladder Cancer Neg Hx    Kidney cancer Neg Hx  Social History Social History   Tobacco Use   Smoking status: Former    Packs/day: 1.00    Years: 40.00    Pack years: 40.00    Types: Cigarettes    Quit date: 08/18/2011    Years since quitting: 9.3   Smokeless tobacco: Never  Vaping Use   Vaping Use: Never used  Substance Use Topics   Alcohol use: Yes    Alcohol/week: 21.0 standard drinks    Types: 21 Shots of liquor per week    Comment: 3 whiskey drinks a day   Drug use: Never     Review of Systems  Constitutional: No fever/chills Eyes:  No discharge ENT: No upper respiratory complaints. Respiratory: no cough. No SOB/ use of accessory muscles to breath Gastrointestinal:   No nausea, no vomiting.  No  diarrhea.  No constipation. Musculoskeletal: Patient has right hip pain.  Skin: Negative for rash, abrasions, lacerations, ecchymosis.    ____________________________________________   PHYSICAL EXAM:  VITAL SIGNS: ED Triage Vitals  Enc Vitals Group     BP 12/31/20 0815 112/70     Pulse Rate 12/31/20 0815 74     Resp --      Temp 12/31/20 0815 99 F (37.2 C)     Temp Source 12/31/20 0815 Oral     SpO2 12/31/20 0815 95 %     Weight --      Height --      Head Circumference --      Peak Flow --      Pain Score 12/31/20 0813 7     Pain Loc --      Pain Edu? --      Excl. in [place]? --      Constitutional: Alert and oriented. Well appearing and in no acute distress. Eyes: Conjunctivae are normal. PERRL. EOMI. Head: Atraumatic. ENT: Cardiovascular: Normal rate, regular rhythm. Normal S1 and S2.  Good peripheral circulation. Respiratory: Normal respiratory effort without tachypnea or retractions. Lungs CTAB. Good air entry to the bases with no decreased or absent breath sounds Gastrointestinal: Bowel sounds x 4 quadrants. Soft and nontender to palpation. No guarding or rigidity. No distention. Musculoskeletal: Patient has groin pain with internal and external rotation at the right hip.  Palpable dorsalis pedis pulse, right.  No reproducible pain with palpation of the right trochanteric bursa.  Negative straight leg raise on the right. Neurologic:  Normal for age. No gross focal neurologic deficits are appreciated.  Skin:  Skin is warm, dry and intact. No rash noted. Psychiatric: Mood and affect are normal for age. Speech and behavior are normal.   ____________________________________________   LABS (all labs ordered are listed, but only abnormal results are displayed)  Labs Reviewed - No data to display ____________________________________________  EKG   ____________________________________________  RADIOLOGY  No results  found.  ____________________________________________    PROCEDURES  Procedure(s) performed:     Procedures     Medications - No data to display   ____________________________________________   INITIAL IMPRESSION / ASSESSMENT AND PLAN / ED COURSE  Pertinent labs & imaging results that were available during my care of the patient were reviewed by me and considered in my medical decision making (see chart for details).      Assessment and plan Hip pain 80 year old male presents to the urgent care with right hip pain for the past 2 days.  Vital signs are reassuring at triage.  On physical exam, patient was alert, active and nontoxic-appearing with reproducible pain  with internal and external rotation at the right hip.  Suspect arthritis.  Low suspicion for fracture given absence of trauma.  We will treat patient with low-dose meloxicam once daily for the next week.  Advised patient to follow-up with orthopedics if symptoms do not seem to be improving for possible ultrasound-guided  cortisone injection.  Patient voiced understanding and feels comfortable with plan.     ____________________________________________  FINAL CLINICAL IMPRESSION(S) / ED DIAGNOSES  Final diagnoses:  Hip pain      NEW MEDICATIONS STARTED DURING THIS VISIT:  ED Discharge Orders          Ordered    meloxicam (MOBIC) 7.5 MG tablet  Daily        12/31/20 0829                This chart was dictated using voice recognition software/Dragon. Despite best efforts to proofread, errors can occur which can change the meaning. Any change was purely unintentional.     Lannie Fields, [place] 12/31/20 [phone]

## 2021-01-10 DIAGNOSIS — M160 Bilateral primary osteoarthritis of hip: Secondary | ICD-10-CM | POA: Diagnosis not present

## 2021-01-23 DIAGNOSIS — M25551 Pain in right hip: Secondary | ICD-10-CM | POA: Diagnosis not present

## 2021-01-30 DIAGNOSIS — M25551 Pain in right hip: Secondary | ICD-10-CM | POA: Diagnosis not present

## 2021-02-11 ENCOUNTER — Encounter: Payer: Medicare Other | Admitting: Family Medicine

## 2021-02-18 DIAGNOSIS — M25551 Pain in right hip: Secondary | ICD-10-CM | POA: Diagnosis not present

## 2021-03-06 ENCOUNTER — Other Ambulatory Visit: Payer: Self-pay | Admitting: Family Medicine

## 2021-03-06 NOTE — Telephone Encounter (Signed)
Future OV 07/08/21  Approved per protocol both Celexa and Atorvastatin.  Requested Prescriptions  Pending Prescriptions Disp Refills   citalopram (CELEXA) 20 MG tablet [Pharmacy Med Name: CITALOPRAM 20MG  TABLETS] 90 tablet 0    Sig: TAKE 1 TABLET(20 MG) BY MOUTH DAILY     Psychiatry:  Antidepressants - SSRI Passed - 03/06/2021 10:23 AM      Passed - Valid encounter within last 6 months    Recent Outpatient Visits          4 months ago Benign paroxysmal positional vertigo of right ear   [facility] [place], [place], [place]   6 months ago Primary hypertension   [facility] [place], [place], [place]   8 months ago Traumatic incomplete tear of left rotator cuff, subsequent encounter   [facility], MD   1 year ago Encounter for annual physical exam   [facility] [place], [place], [place]   1 year ago Traumatic incomplete tear of left rotator cuff, subsequent encounter   [facility] [place], [place], Alessandra Bevels      Future Appointments            In 4 months Bacigalupo, [place], MD [facility], [facility]            atorvastatin (LIPITOR) 40 MG tablet [Pharmacy Med Name: ATORVASTATIN 40MG  TABLETS] 90 tablet 0    Sig: TAKE 1 TABLET(40 MG) BY MOUTH DAILY AT 6 PM     Cardiovascular:  Antilipid - Statins Failed - 03/06/2021 10:23 AM      Failed - Total Cholesterol in normal range and within 360 days    Cholesterol, Total  Date Value Ref Range Status  02/07/2020 165 100 - 199 mg/dL Final         Failed - LDL in normal range and within 360 days    LDL Chol Calc (NIH)  Date Value Ref Range Status  02/07/2020 83 0 - 99 mg/dL Final         Failed - HDL in normal range and within 360 days    HDL  Date Value Ref Range Status  02/07/2020 63 >39 mg/dL Final         Failed - Triglycerides in normal range and within 360 days    Triglycerides  Date Value Ref Range Status  02/07/2020 106  0 - 149 mg/dL Final         Passed - Patient is not pregnant      Passed - Valid encounter within last 12 months    Recent Outpatient Visits          4 months ago Benign paroxysmal positional vertigo of right ear   [facility] [place], [facility], MD   6 months ago Primary hypertension   [facility] [place], [facility], MD   8 months ago Traumatic incomplete tear of left rotator cuff, subsequent encounter   [facility] Marzella Schlein, MD   1 year ago Encounter for annual physical exam   [facility] [place], [facility], MD   1 year ago Traumatic incomplete tear of left rotator cuff, subsequent encounter   [facility] [place], [facility], [place]      Future Appointments            In 4 months Bacigalupo, Alessandra [place], MD [facility], [facility]

## 2021-03-13 ENCOUNTER — Other Ambulatory Visit: Payer: Self-pay | Admitting: Family Medicine

## 2021-03-14 ENCOUNTER — Other Ambulatory Visit: Payer: Self-pay | Admitting: Family Medicine

## 2021-03-14 NOTE — Telephone Encounter (Signed)
Requested Prescriptions  Pending Prescriptions Disp Refills   hydrochlorothiazide (HYDRODIURIL) 25 MG tablet [Pharmacy Med Name: HYDROCHLOROTHIAZIDE 25MG  TABLETS] 30 tablet 0    Sig: TAKE 1 TABLET(25 MG) BY MOUTH DAILY     Cardiovascular: Diuretics - Thiazide Passed - 03/14/2021  2:59 PM      Passed - Ca in normal range and within 360 days    Calcium  Date Value Ref Range Status  08/09/2020 9.4 8.6 - 10.2 mg/dL Final  10/09/2020 9.8  Final         Passed - Cr in normal range and within 360 days    Creatinine, Ser  Date Value Ref Range Status  08/09/2020 1.21 0.76 - 1.27 mg/dL Final         Passed - K in normal range and within 360 days    Potassium  Date Value Ref Range Status  08/09/2020 3.9 3.5 - 5.2 mmol/L Final         Passed - Na in normal range and within 360 days    Sodium  Date Value Ref Range Status  08/09/2020 138 134 - 144 mmol/L Final         Passed - Last BP in normal range    BP Readings from Last 1 Encounters:  12/31/20 112/70         Passed - Valid encounter within last 6 months    Recent Outpatient Visits          5 months ago Benign paroxysmal positional vertigo of right ear   [facility] [place], [place], MD   7 months ago Primary hypertension   [facility] [place], [place], MD   8 months ago Traumatic incomplete tear of left rotator cuff, subsequent encounter   [facility], MD   1 year ago Encounter for annual physical exam   [facility] [place], [place], MD   1 year ago Traumatic incomplete tear of left rotator cuff, subsequent encounter   [facility] [place], [place], Alessandra Bevels      Future Appointments            In 3 months Bacigalupo, [place], MD [facility], [facility]            TIADYLT ER 300 MG 24 hr capsule [Pharmacy Med Name: TIADYLT ER 300MG  CAPSULES (24 HR)] 90 capsule 0    Sig: TAKE 1 CAPSULE(300 MG) BY MOUTH  DAILY     Cardiovascular:  Calcium Channel Blockers Passed - 03/14/2021  2:59 PM      Passed - Last BP in normal range    BP Readings from Last 1 Encounters:  12/31/20 112/70         Passed - Valid encounter within last 6 months    Recent Outpatient Visits          5 months ago Benign paroxysmal positional vertigo of right ear   [facility] [place], [facility], MD   7 months ago Primary hypertension   [facility], [facility], MD   8 months ago Traumatic incomplete tear of left rotator cuff, subsequent encounter   [facility] Marzella Schlein, MD   1 year ago Encounter for annual physical exam   [facility] [place], [facility], MD   1 year ago Traumatic incomplete tear of left rotator cuff, subsequent encounter   [facility] [place], [facility], [place]      Future  Appointments            In 3 months Bacigalupo, Marzella Schlein, MD [facility], [facility]

## 2021-03-14 NOTE — Telephone Encounter (Signed)
Requested Prescriptions  Pending Prescriptions Disp Refills   TIADYLT ER 300 MG 24 hr capsule [Pharmacy Med Name: TIADYLT ER 300MG  CAPSULES (24 HR)] 90 capsule 0    Sig: TAKE 1 CAPSULE(300 MG) BY MOUTH DAILY     Cardiovascular:  Calcium Channel Blockers Passed - 03/14/2021  2:59 PM      Passed - Last BP in normal range    BP Readings from Last 1 Encounters:  12/31/20 112/70         Passed - Valid encounter within last 6 months    Recent Outpatient Visits          5 months ago Benign paroxysmal positional vertigo of right ear   [facility] [place], [place], MD   7 months ago Primary hypertension   [facility] [place], [place], MD   8 months ago Traumatic incomplete tear of left rotator cuff, subsequent encounter   [facility], MD   1 year ago Encounter for annual physical exam   [facility] [place], [place], MD   1 year ago Traumatic incomplete tear of left rotator cuff, subsequent encounter   [facility] [place], [place], Alessandra Bevels      Future Appointments            In 3 months Bacigalupo, [place], MD [facility], [facility]           Signed Prescriptions Disp Refills   hydrochlorothiazide (HYDRODIURIL) 25 MG tablet 30 tablet 0    Sig: TAKE 1 TABLET(25 MG) BY MOUTH DAILY     Cardiovascular: Diuretics - Thiazide Passed - 03/14/2021  2:59 PM      Passed - Ca in normal range and within 360 days    Calcium  Date Value Ref Range Status  08/09/2020 9.4 8.6 - 10.2 mg/dL Final  10/09/2020 9.8  Final         Passed - Cr in normal range and within 360 days    Creatinine, Ser  Date Value Ref Range Status  08/09/2020 1.21 0.76 - 1.27 mg/dL Final         Passed - K in normal range and within 360 days    Potassium  Date Value Ref Range Status  08/09/2020 3.9 3.5 - 5.2 mmol/L Final         Passed - Na in normal range and within 360 days    Sodium  Date Value Ref  Range Status  08/09/2020 138 134 - 144 mmol/L Final         Passed - Last BP in normal range    BP Readings from Last 1 Encounters:  12/31/20 112/70         Passed - Valid encounter within last 6 months    Recent Outpatient Visits          5 months ago Benign paroxysmal positional vertigo of right ear   [facility] [place], [place], MD   7 months ago Primary hypertension   [facility] [place], [place], MD   8 months ago Traumatic incomplete tear of left rotator cuff, subsequent encounter   [facility], MD   1 year ago Encounter for annual physical exam   [facility] [place], [place], MD   1 year ago Traumatic incomplete tear of left rotator cuff, subsequent encounter   [facility] [place], [place], Alessandra Bevels      Future Appointments  In 3 months Bacigalupo, Marzella Schlein, MD [facility], [facility]

## 2021-04-30 ENCOUNTER — Telehealth: Payer: Self-pay | Admitting: Family Medicine

## 2021-04-30 MED ORDER — LOSARTAN POTASSIUM 100 MG PO TABS: 100.0000 mg | ORAL_TABLET | Freq: Every day | ORAL | 0 refills | Status: DC

## 2021-04-30 NOTE — Telephone Encounter (Signed)
[facility] faxed refill request for the following medications:   losartan (COZAAR) 100 MG tablet    Please advise.  

## 2021-05-23 ENCOUNTER — Other Ambulatory Visit: Payer: Self-pay

## 2021-06-11 ENCOUNTER — Other Ambulatory Visit: Payer: Self-pay | Admitting: Family Medicine

## 2021-06-13 ENCOUNTER — Other Ambulatory Visit: Payer: Self-pay | Admitting: Family Medicine

## 2021-06-17 ENCOUNTER — Ambulatory Visit
Admission: RE | Admit: 2021-06-17 | Discharge: 2021-06-17 | Disposition: A | Discharge status: Home / Self Care | Payer: Medicare Other | Source: Ambulatory Visit | Attending: Acute Care | Admitting: Acute Care

## 2021-06-17 ENCOUNTER — Telehealth: Payer: Self-pay | Admitting: Acute Care

## 2021-06-17 DIAGNOSIS — I700 Atherosclerosis of aorta: Secondary | ICD-10-CM | POA: Diagnosis not present

## 2021-06-17 DIAGNOSIS — Z87891 Personal history of nicotine dependence: Secondary | ICD-10-CM | POA: Diagnosis not present

## 2021-06-17 DIAGNOSIS — Z122 Encounter for screening for malignant neoplasm of respiratory organs: Secondary | ICD-10-CM | POA: Diagnosis not present

## 2021-06-17 NOTE — Telephone Encounter (Signed)
Received call report from [facility] with [facility] on patient's lung cancer screen CT done on 06/17/21. ?Sarah, please review the result/impression copied below: ? ?IMPRESSION: ?1. Lung-RADS 2, benign appearance or behavior. Continue annual ?screening with low-dose chest CT without contrast in 12 months. ?2. 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 Abdominal MRI with and without contrast recommended ?to further evaluate. ?3. Aortic Atherosclerosis (ICD10-I70.0). ? ?Please advise, thank you. ? ?

## 2021-06-18 DIAGNOSIS — M1611 Unilateral primary osteoarthritis, right hip: Secondary | ICD-10-CM | POA: Diagnosis not present

## 2021-06-19 ENCOUNTER — Telehealth: Payer: Self-pay

## 2021-06-19 NOTE — Telephone Encounter (Signed)
Patient requesting results for CT chest lung cancer scan completed on 06/17/21.  ?

## 2021-06-19 NOTE — Telephone Encounter (Signed)
I have attempted to call the patient with the results of his low dose Ct Chest. There was no answer. I have left a HIPPA compliant message on the patient's VM requesting that he call the office for his scan results. I have asked, that if he gets a VM, to leave the best times for [place] to return his call. I have provided the office call back number on the VM.  ?Langley Gauss, patient will need a 12 month follow up low dose CT Chest.  ?We will notify the PCP once I have made contact with the patient.  ?Thanks so much ?

## 2021-06-20 ENCOUNTER — Telehealth: Payer: Self-pay | Admitting: Acute Care

## 2021-06-20 DIAGNOSIS — R1900 Intra-abdominal and pelvic swelling, mass and lump, unspecified site: Secondary | ICD-10-CM

## 2021-06-20 DIAGNOSIS — Z122 Encounter for screening for malignant neoplasm of respiratory organs: Secondary | ICD-10-CM

## 2021-06-20 DIAGNOSIS — K869 Disease of pancreas, unspecified: Secondary | ICD-10-CM

## 2021-06-20 DIAGNOSIS — Z87891 Personal history of nicotine dependence: Secondary | ICD-10-CM

## 2021-06-20 NOTE — Telephone Encounter (Signed)
See other phone note

## 2021-06-20 NOTE — Telephone Encounter (Signed)
Patient advised.

## 2021-06-20 NOTE — Telephone Encounter (Signed)
I have called the patient with the results of his  low dose CT Chest. I explained from a lung cancer perspective that his scan was read as a LR 2, benign appearance or behavior. Continue annual screening with low-dose chest CT without contrast in 12 months. ?Langley Gauss, please fax results to PCP and order 12 month annual screening scan.  ? 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  ( 2.3 cm >> 3.0 cm )Abdominal MRI with and without contrast  was recommended by radiology to  further evaluate.  ? ?The patient was aware of this as he had seen his results through My Chart. He has an appointment with his PCP 06/24/2021 to discuss this with her. I have included her on this telephone note. I have explained that he should discuss this finding with her , and she will follow up as she feels is indicated.  ? ?Additionally, there was notation of Coronary artery calcification  and  Mild ?atherosclerotic calcification  noted in the wall of the thoracic aorta.The patient is on statin therapy. He will address this with Dr. Brita Romp on 06/24/2021.  ? ?Dr. Brita Romp, please don't hesitate to contact me if you have any questions or concerns.  ? ?Thanks so much ?

## 2021-06-21 ENCOUNTER — Other Ambulatory Visit: Payer: Self-pay | Admitting: Family Medicine

## 2021-06-21 NOTE — Progress Notes (Signed)
?  ? ?I,Sulibeya S Dimas,acting as a scribe for Shirlee Latch, MD.,have documented all relevant documentation on the behalf of Shirlee Latch, MD,as directed by  Shirlee Latch, MD while in the presence of Shirlee Latch, MD. ? ? ?Established patient visit ? ? ?Patient: Isaac Watkins   DOB: Aug 14, 1940   81 y.o. Male  MRN: [id number] ?Visit Date: 06/24/2021 ? ?Today's healthcare provider: Shirlee Latch, MD  ? ?Chief Complaint  ?Patient presents with  ? Pre-op Exam  ? ?Subjective  ?  ? Subjective: ? ?Isaac Watkins is a 81 y.o. male who presents to the office today for a preoperative consultation at the request of surgeon Dr. Odis Luster who plans on performing right total hip arthroplasty on July 04, 2021. This consultation is requested for the specific conditions prompting preoperative evaluation (i.e. because of potential affect on operative risk): age.  Patients bleeding risk: no recent abnormal bleeding. Patient does not have objections to receiving blood products if needed. ?  ?Pt is a 81 y.o. male who is here for preoperative clearance for R total hip replacement ? ?1) High Risk Cardiac Conditions ? 1) Recent MI - No. ? 2) Decompensated Heart Failure - No. ? 3) Unstable angina - No. ? 4) Symptomatic arrythmia - No. ? 5) Sx Valvular Disease - No. ? ?2) Intermediate Risk Factors - DM, CKD, CVA, CHF, CAD - No. ? ?2) Functional Status - > 4 mets (Walk, run, climb stairs) Yes.  Isaac Watkins Activity Status Index: 30.2 (6.45 METS) ? ?3) Surgery Specific Risk -  Intermediate (Carotid, Head and Neck, Orthopaedic ) ? ?4) Further Noninvasive evaluation -  ? 1) EKG - Yes.   ?  1) Hx of CVA, CAD, DM, CKD ? 2) Echo - No. ?  1) Worsening dyspnea  ? 3) Stress Testing - Active Cardiac Disease - No. ? ?5) Need for medical therapy - Beta Blocker, Statins indicated ? No. ? ? ? ? ? ?  ? ?Medications: ?Outpatient Medications Prior to Visit  ?Medication Sig  ? atorvastatin (LIPITOR) 40 MG tablet TAKE 1 TABLET(40 MG)  BY MOUTH DAILY AT 6 PM  ? citalopram (CELEXA) 20 MG tablet TAKE 1 TABLET(20 MG) BY MOUTH DAILY  ? hydrochlorothiazide (HYDRODIURIL) 25 MG tablet TAKE 1 TABLET(25 MG) BY MOUTH DAILY  ? losartan (COZAAR) 100 MG tablet Take 1 tablet (100 mg total) by mouth daily.  ? TIADYLT ER 300 MG 24 hr capsule TAKE 1 CAPSULE(300 MG) BY MOUTH DAILY  ? Tiotropium Bromide Monohydrate (SPIRIVA RESPIMAT) 2.5 MCG/ACT AERS Inhale 2 puffs into the lungs daily.  ? [DISCONTINUED] meclizine (ANTIVERT) 25 MG tablet Take 1 tablet (25 mg total) by mouth 3 (three) times daily as needed for dizziness.  ? ?No facility-administered medications prior to visit.  ? ? ?Review of Systems per HPI ? ? ?  Objective  ?  ?BP 120/66 (BP Location: Left Arm, Patient Position: Sitting, Cuff Size: Large)   Pulse 70   Temp 98.3 ?F (36.8 ?C) (Oral)   Resp 16   Ht 6' 0.5" (1.842 m)   Wt 238 lb 14.4 oz (108.4 kg)   SpO2 95%   BMI 31.96 kg/m?  ? ? ?Physical Exam ?Vitals reviewed.  ?Constitutional:   ?   General: He is not in acute distress. ?   Appearance: Normal appearance. He is not diaphoretic.  ?HENT:  ?   Head: Normocephalic and atraumatic.  ?Eyes:  ?   General: No scleral icterus. ?   Conjunctiva/sclera: Conjunctivae  normal.  ?Cardiovascular:  ?   Rate and Rhythm: Normal rate and regular rhythm.  ?   Pulses: Normal pulses.  ?   Heart sounds: Normal heart sounds. No murmur heard. ?Pulmonary:  ?   Effort: Pulmonary effort is normal. No respiratory distress.  ?   Breath sounds: Normal breath sounds. No wheezing or rhonchi.  ?Abdominal:  ?   General: There is no distension.  ?   Palpations: Abdomen is soft.  ?   Tenderness: There is no abdominal tenderness.  ?Musculoskeletal:  ?   Cervical back: Neck supple.  ?   Right lower leg: No edema.  ?   Left lower leg: No edema.  ?Skin: ?   General: Skin is warm and dry.  ?Neurological:  ?   Mental Status: He is alert and oriented to person, place, and time.  ?Psychiatric:     ?   Mood and Affect: Mood normal.     ?    Behavior: Behavior normal.  ?  ? ? ?No results found for any visits on 06/24/21. ? Assessment & Plan  ?  ? ?1. Preop examination ?- EKG 12-Lead ?2. Aortic atherosclerosis (HCC) ?3. Pulmonary emphysema, unspecified emphysema type (HCC) ?- chronic and stable Emphysema - continue controller inhaler ?- continue statin  ? ? ?I have independently evaluated patient.  Isaac Watkins is a 81 y.o. male who is low  risk for a intermediate risk surgery.  There are not  modifiable risk factors (smoking, etc). ?Isaac Watkins RCRI/NSQIP calculation for MACE is: 2.8 % (below average).  ? ?No need for CXR given Chest CT clear 1 wk ago.  EKG is stable with no ischemic signs. No need for additional testing such as Echo, stress test.  Discussed meds - no ACE/ARB on AM of surgery.  Labs per Surgery request. Will complete form for clearance and results when available.  ? ? ? ?No follow-ups on file.  ?   ? ?I, Shirlee Latch, MD, have reviewed all documentation for this visit. The documentation on 06/24/21 for the exam, diagnosis, procedures, and orders are all accurate and complete. ? ? ?Erasmo Downer, MD, MPH ?[facility] ?[facility]   ?

## 2021-06-24 ENCOUNTER — Encounter: Payer: Self-pay | Admitting: Family Medicine

## 2021-06-24 ENCOUNTER — Encounter: Payer: Medicare Other | Admitting: Family Medicine

## 2021-06-24 ENCOUNTER — Ambulatory Visit (INDEPENDENT_AMBULATORY_CARE_PROVIDER_SITE_OTHER): Payer: Medicare Other | Admitting: Family Medicine

## 2021-06-24 VITALS — BP 120/66 | HR 70 | Temp 98.3°F | Resp 16 | Ht 72.5 in | Wt 238.9 lb

## 2021-06-24 DIAGNOSIS — J439 Emphysema, unspecified: Secondary | ICD-10-CM | POA: Diagnosis not present

## 2021-06-24 DIAGNOSIS — I700 Atherosclerosis of aorta: Secondary | ICD-10-CM

## 2021-06-24 DIAGNOSIS — Z01818 Encounter for other preprocedural examination: Secondary | ICD-10-CM

## 2021-06-24 LAB — POCT URINALYSIS DIPSTICK
Bilirubin, UA: NEGATIVE
Blood, UA: NEGATIVE
Glucose, UA: NEGATIVE
Ketones, UA: NEGATIVE
Leukocytes, UA: NEGATIVE
Nitrite, UA: NEGATIVE
Protein, UA: NEGATIVE
Spec Grav, UA: >=1.03 — AB (ref 1.010–1.025)
Urobilinogen, UA: 0.2 E.U./dL
pH, UA: 6 (ref 5.0–8.0)

## 2021-06-25 LAB — BASIC METABOLIC PANEL
BUN/Creatinine Ratio: 20 (ref 10–24)
BUN: 23 mg/dL (ref 8–27)
CO2: 23 mmol/L (ref 20–29)
Calcium: 9.5 mg/dL (ref 8.6–10.2)
Chloride: 102 mmol/L (ref 96–106)
Creatinine, Ser: 1.17 mg/dL (ref 0.76–1.27)
Glucose: 107 mg/dL — ABNORMAL HIGH (ref 70–99)
Potassium: 4.1 mmol/L (ref 3.5–5.2)
Sodium: 140 mmol/L (ref 134–144)
eGFR: 64 mL/min/{1.73_m2} (ref >59)

## 2021-06-25 LAB — HEMOGLOBIN A1C
Est. average glucose Bld gHb Est-mCnc: 111 mg/dL
Hgb A1c MFr Bld: 5.5 % (ref 4.8–5.6)

## 2021-06-25 LAB — ALBUMIN: Albumin: 4.2 g/dL (ref 3.7–4.7)

## 2021-06-25 LAB — CBC
Hematocrit: 37.9 % (ref 37.5–51.0)
Hemoglobin: 13.3 g/dL (ref 13.0–17.7)
MCH: 32.9 pg (ref 26.6–33.0)
MCHC: 35.1 g/dL (ref 31.5–35.7)
MCV: 94 fL (ref 79–97)
Platelets: 255 10*3/uL (ref 150–450)
RBC: 4.04 x10E6/uL — ABNORMAL LOW (ref 4.14–5.80)
RDW: 12.1 % (ref 11.6–15.4)
WBC: 7.5 10*3/uL (ref 3.4–10.8)

## 2021-06-26 NOTE — Progress Notes (Signed)
Faxed office notes, labs, ekg, and CT scan results.  ?

## 2021-06-28 ENCOUNTER — Telehealth: Payer: Self-pay | Admitting: Family Medicine

## 2021-06-28 NOTE — Telephone Encounter (Signed)
Referral coordinator will call them. ?

## 2021-06-28 NOTE — Telephone Encounter (Signed)
Kylie calling from [facility] is calling to report that the patient's insurance [facility] Medicare is requiring a Precertification. ?Please advise [phone] ?

## 2021-06-29 DIAGNOSIS — M1611 Unilateral primary osteoarthritis, right hip: Secondary | ICD-10-CM | POA: Diagnosis not present

## 2021-07-01 ENCOUNTER — Encounter: Payer: Self-pay | Admitting: Family Medicine

## 2021-07-02 ENCOUNTER — Ambulatory Visit: Payer: Medicare Other

## 2021-07-04 DIAGNOSIS — E785 Hyperlipidemia, unspecified: Secondary | ICD-10-CM | POA: Diagnosis not present

## 2021-07-04 DIAGNOSIS — J439 Emphysema, unspecified: Secondary | ICD-10-CM | POA: Diagnosis not present

## 2021-07-04 DIAGNOSIS — N400 Enlarged prostate without lower urinary tract symptoms: Secondary | ICD-10-CM | POA: Diagnosis not present

## 2021-07-04 DIAGNOSIS — I10 Essential (primary) hypertension: Secondary | ICD-10-CM | POA: Diagnosis not present

## 2021-07-04 DIAGNOSIS — M1611 Unilateral primary osteoarthritis, right hip: Secondary | ICD-10-CM | POA: Diagnosis not present

## 2021-07-05 ENCOUNTER — Ambulatory Visit: Payer: Medicare Other

## 2021-07-05 DIAGNOSIS — J439 Emphysema, unspecified: Secondary | ICD-10-CM | POA: Diagnosis not present

## 2021-07-05 DIAGNOSIS — N400 Enlarged prostate without lower urinary tract symptoms: Secondary | ICD-10-CM | POA: Diagnosis not present

## 2021-07-05 DIAGNOSIS — I10 Essential (primary) hypertension: Secondary | ICD-10-CM | POA: Diagnosis not present

## 2021-07-05 DIAGNOSIS — M1611 Unilateral primary osteoarthritis, right hip: Secondary | ICD-10-CM | POA: Diagnosis not present

## 2021-07-05 DIAGNOSIS — E785 Hyperlipidemia, unspecified: Secondary | ICD-10-CM | POA: Diagnosis not present

## 2021-07-08 ENCOUNTER — Encounter: Payer: Medicare Other | Admitting: Family Medicine

## 2021-07-11 DIAGNOSIS — M1611 Unilateral primary osteoarthritis, right hip: Secondary | ICD-10-CM | POA: Diagnosis not present

## 2021-07-12 ENCOUNTER — Ambulatory Visit: Payer: Medicare Other | Admitting: Physician Assistant

## 2021-07-12 ENCOUNTER — Encounter: Payer: Self-pay | Admitting: Physician Assistant

## 2021-07-12 VITALS — BP 139/82 | HR 85 | Ht 72.0 in | Wt 242.9 lb

## 2021-07-12 DIAGNOSIS — J209 Acute bronchitis, unspecified: Secondary | ICD-10-CM | POA: Diagnosis not present

## 2021-07-12 MED ORDER — METHYLPREDNISOLONE 4 MG PO TBPK: ORAL_TABLET | ORAL | 0 refills | Status: DC

## 2021-07-12 NOTE — Progress Notes (Signed)
?  ? ?I,Sha'taria Tyson,acting as a Neurosurgeon for [employer], PA-C.,have documented all relevant documentation on the behalf of Alfredia Ferguson, PA-C,as directed by  Alfredia Ferguson, PA-C while in the presence of Alfredia Ferguson, PA-C. ? ? ?Established patient visit ? ? ?Patient: Isaac Watkins   DOB: 10-11-1940   81 y.o. Male  MRN: [id number] ?Visit Date: 07/12/2021 ? ?Today's healthcare provider: Alfredia Ferguson, PA-C  ? ?Cc. Cough, congestion x 1 week ? ?Subjective  ?  ?HPI  ?Jaser is a 81 y/o male w/ a history of emphysema who presents today with increased cough w/ green mucous, chest congestion, nasal congestion for the last few days. Denies fever,chills, body aches, fatigue.  ? ?Medications: ?Outpatient Medications Prior to Visit  ?Medication Sig  ? aspirin 81 MG chewable tablet aspirin 81 mg chewable tablet ? CHEW AND SWALLOW 1 TABLET BY MOUTH TWICE DAILY  ? atorvastatin (LIPITOR) 40 MG tablet TAKE 1 TABLET(40 MG) BY MOUTH DAILY AT 6 PM  ? citalopram (CELEXA) 20 MG tablet TAKE 1 TABLET(20 MG) BY MOUTH DAILY  ? Docusate Sodium (DSS) 100 MG CAPS docusate sodium 100 mg capsule ? TAKE 1 CAPSULE BY MOUTH TWICE DAILY  ? hydrochlorothiazide (HYDRODIURIL) 25 MG tablet TAKE 1 TABLET(25 MG) BY MOUTH DAILY  ? losartan (COZAAR) 100 MG tablet Take 1 tablet (100 mg total) by mouth daily.  ? Oxycodone HCl 10 MG TABS oxycodone 10 mg tablet ? TAKE 1 TABLET BY MOUTH EVERY 4 HOURS  ? TIADYLT ER 300 MG 24 hr capsule TAKE 1 CAPSULE(300 MG) BY MOUTH DAILY  ? Tiotropium Bromide Monohydrate (SPIRIVA RESPIMAT) 2.5 MCG/ACT AERS Inhale 2 puffs into the lungs daily.  ? ?No facility-administered medications prior to visit.  ? ? ?Review of Systems  ?Constitutional:  Negative for fatigue and fever.  ?HENT:  Positive for congestion.   ?Respiratory:  Positive for cough, shortness of breath and wheezing.   ?Cardiovascular:  Negative for chest pain, palpitations and leg swelling.  ?Neurological:  Negative for dizziness and headaches.  ? ? ?   Objective  ?  ?Blood pressure 139/82, pulse 85, height 6' (1.829 m), weight 242 lb 14.4 oz (110.2 kg), SpO2 100 %.  ? ?Physical Exam ?Constitutional:   ?   General: He is awake.  ?   Appearance: He is well-developed.  ?HENT:  ?   Head: Normocephalic.  ?   Right Ear: Tympanic membrane normal.  ?   Left Ear: Tympanic membrane normal.  ?   Nose: No congestion or rhinorrhea.  ?   Mouth/Throat:  ?   Pharynx: Posterior oropharyngeal erythema present. No oropharyngeal exudate.  ?Eyes:  ?   Conjunctiva/sclera: Conjunctivae normal.  ?Cardiovascular:  ?   Rate and Rhythm: Normal rate and regular rhythm.  ?   Heart sounds: Normal heart sounds.  ?Pulmonary:  ?   Effort: Pulmonary effort is normal.  ?   Breath sounds: Wheezing present.  ?Skin: ?   General: Skin is warm.  ?Neurological:  ?   Mental Status: He is alert and oriented to person, place, and time.  ?Psychiatric:     ?   Attention and Perception: Attention normal.     ?   Mood and Affect: Mood normal.     ?   Speech: Speech normal.     ?   Behavior: Behavior is cooperative.  ?  ? ?No results found for any visits on 07/12/21. ? Assessment & Plan  ?  ? ?Problem List Items Addressed This  Visit   ? ?  ? Respiratory  ? Acute bronchitis - Primary  ?  Advised pt use albuterol inhaler prn chest tightness/wheezing. ?rx medrol dose pack ?Advised mucinex for chest congestion, increase fluids ? ?  ?  ? Relevant Medications  ? methylPREDNISolone (MEDROL DOSEPAK) 4 MG TBPK tablet  ?  ? ?Return if symptoms worsen or fail to improve.  ?   ? ?I, Alfredia Ferguson, PA-C have reviewed all documentation for this visit. The documentation on  07/12/2021 for the exam, diagnosis, procedures, and orders are all accurate and complete. ? ?Alfredia Ferguson, PA-C ?[facility] ?[address] ?[place], [place], [address] ?Office: [phone] ?Fax: [phone]  ? ?[facility] ? ?

## 2021-07-12 NOTE — Assessment & Plan Note (Signed)
Advised pt use albuterol inhaler prn chest tightness/wheezing. ?rx medrol dose pack ?Advised mucinex for chest congestion, increase fluids ?

## 2021-07-15 DIAGNOSIS — M1611 Unilateral primary osteoarthritis, right hip: Secondary | ICD-10-CM | POA: Diagnosis not present

## 2021-07-16 ENCOUNTER — Ambulatory Visit: Payer: Medicare Other | Admitting: Physician Assistant

## 2021-07-16 ENCOUNTER — Encounter: Payer: Self-pay | Admitting: Physician Assistant

## 2021-07-16 VITALS — BP 162/93 | HR 82 | Ht 77.0 in | Wt 238.0 lb

## 2021-07-16 DIAGNOSIS — J439 Emphysema, unspecified: Secondary | ICD-10-CM | POA: Insufficient documentation

## 2021-07-16 MED ORDER — SPIRIVA RESPIMAT 2.5 MCG/ACT IN AERS: 2.0000 | INHALATION_SPRAY | Freq: Every day | RESPIRATORY_TRACT | 5 refills | Status: DC

## 2021-07-16 MED ORDER — HYDROCOD POLI-CHLORPHE POLI ER 10-8 MG/5ML PO SUER: 5.0000 mL | Freq: Every evening | ORAL | 0 refills | Status: DC | PRN

## 2021-07-16 MED ORDER — ALBUTEROL SULFATE HFA 108 (90 BASE) MCG/ACT IN AERS: 2.0000 | INHALATION_SPRAY | Freq: Four times a day (QID) | RESPIRATORY_TRACT | 5 refills | Status: DC | PRN

## 2021-07-16 MED ORDER — AMOXICILLIN-POT CLAVULANATE 875-125 MG PO TABS: 1.0000 | ORAL_TABLET | Freq: Two times a day (BID) | ORAL | 0 refills | Status: DC

## 2021-07-16 NOTE — Assessment & Plan Note (Signed)
-   Refilled Spiriva  

## 2021-07-16 NOTE — Assessment & Plan Note (Addendum)
Continue steroid pack ?Added Augmentin 875 mg bid x 10 days ?Refilled albuterol inhaler, advised he can use q 6 hr for coughing spellings/sob/wheezing.  ?rx tussionex, advised only use at night for cough relief if needed ? ?

## 2021-07-16 NOTE — Progress Notes (Signed)
?  ? ? ?I,Isaac Watkins,acting as a Education administrator for [employer], PA-C.,have documented all relevant documentation on the behalf of Isaac Kirschner, PA-C,as directed by  Isaac Kirschner, PA-C while in the presence of Isaac Kirschner, PA-C. ? ?Established patient visit ? ? ?Patient: Isaac Watkins   DOB: Sep 22, 1940   80 y.o. Male  MRN: [id number] ?Visit Date: 07/16/2021 ? ?Today's healthcare provider: Mikey Kirschner, PA-C  ? ?Cc. Continued cough ,SOB  ? ?Subjective  ?  ? ?Isaac Watkins was seen 07/11/2021 for the same symptoms and advised to use albuterol inhaler, rx steroid back, and mucinex. Reports he is a few days into the steroid pack and does not feel any improvement. Denies any albuterol use as he says he was out. Reports nearly being out of Spiriva.  ?Reports coughing spells that make him SOB, denies SOB at rest. Denies fevers, chills.  ? ? ?Medications: ?Outpatient Medications Prior to Visit  ?Medication Sig  ? aspirin 81 MG chewable tablet aspirin 81 mg chewable tablet ? CHEW AND SWALLOW 1 TABLET BY MOUTH TWICE DAILY  ? atorvastatin (LIPITOR) 40 MG tablet TAKE 1 TABLET(40 MG) BY MOUTH DAILY AT 6 PM  ? citalopram (CELEXA) 20 MG tablet TAKE 1 TABLET(20 MG) BY MOUTH DAILY  ? Docusate Sodium (DSS) 100 MG CAPS docusate sodium 100 mg capsule ? TAKE 1 CAPSULE BY MOUTH TWICE DAILY  ? hydrochlorothiazide (HYDRODIURIL) 25 MG tablet TAKE 1 TABLET(25 MG) BY MOUTH DAILY  ? losartan (COZAAR) 100 MG tablet Take 1 tablet (100 mg total) by mouth daily.  ? methylPREDNISolone (MEDROL DOSEPAK) 4 MG TBPK tablet Take 6 pills on day 1, 5 pills on day 2, 4 pills on day 3, 3 pills on day 4, 2 pills on day 5, 1 pill on day 6  ? Oxycodone HCl 10 MG TABS oxycodone 10 mg tablet ? TAKE 1 TABLET BY MOUTH EVERY 4 HOURS  ? TIADYLT ER 300 MG 24 hr capsule TAKE 1 CAPSULE(300 MG) BY MOUTH DAILY  ? [DISCONTINUED] Tiotropium Bromide Monohydrate (SPIRIVA RESPIMAT) 2.5 MCG/ACT AERS Inhale 2 puffs into the lungs daily.  ? ?No facility-administered medications  prior to visit.  ? ? ?Review of Systems  ?HENT:  Positive for postnasal drip.   ?Respiratory:  Positive for cough, shortness of breath and wheezing.   ? ?  Objective  ?Blood pressure (!) 162/93, pulse 82, height 6\' 5"  (1.956 m), weight 238 lb (108 kg), SpO2 96 %.  ? ?Physical Exam ?Constitutional:   ?   General: He is awake.  ?   Appearance: He is well-developed.  ?HENT:  ?   Head: Normocephalic.  ?Eyes:  ?   Conjunctiva/sclera: Conjunctivae normal.  ?Cardiovascular:  ?   Rate and Rhythm: Normal rate and regular rhythm.  ?   Heart sounds: Normal heart sounds.  ?Pulmonary:  ?   Effort: Pulmonary effort is normal.  ?   Breath sounds: Wheezing present.  ?Skin: ?   General: Skin is warm.  ?Neurological:  ?   Mental Status: He is alert and oriented to person, place, and time.  ?Psychiatric:     ?   Attention and Perception: Attention normal.     ?   Mood and Affect: Mood normal.     ?   Speech: Speech normal.     ?   Behavior: Behavior is cooperative.  ?  ? ?No results found for any visits on 07/16/21. ? Assessment & Plan  ?  ? ?Problem List Items Addressed This Visit   ? ?  ?  Respiratory  ? Pulmonary emphysema ([place])  ?  Refilled Spiriva ? ?  ?  ? Relevant Medications  ? albuterol (VENTOLIN HFA) 108 (90 Base) MCG/ACT inhaler  ? chlorpheniramine-HYDROcodone (TUSSIONEX PENNKINETIC ER) 10-8 MG/5ML  ? Tiotropium Bromide Monohydrate (SPIRIVA RESPIMAT) 2.5 MCG/ACT AERS  ? Acute exacerbation of emphysema ([place]) - Primary  ?  Continue steroid pack ?Added Augmentin 875 mg bid x 10 days ?Refilled albuterol inhaler, advised he can use q 6 hr for coughing spellings/sob/wheezing.  ?rx tussionex, advised only use at night for cough relief if needed ? ? ?  ?  ? Relevant Medications  ? amoxicillin-clavulanate (AUGMENTIN) 875-125 MG tablet  ? albuterol (VENTOLIN HFA) 108 (90 Base) MCG/ACT inhaler  ? chlorpheniramine-HYDROcodone (TUSSIONEX PENNKINETIC ER) 10-8 MG/5ML  ? Tiotropium Bromide Monohydrate (SPIRIVA RESPIMAT) 2.5 MCG/ACT AERS   ? ?   ? ?I, Isaac Kirschner, PA-C have reviewed all documentation for this visit. The documentation on  07/16/2021 for the exam, diagnosis, procedures, and orders are all accurate and complete. ? ?Isaac Kirschner, PA-C ?[facility] ?[id number] ?[place], [place], [address] ?Office: [phone] ?Fax: [phone]  ? ?[facility] ? ?

## 2021-07-19 ENCOUNTER — Ambulatory Visit
Admission: RE | Admit: 2021-07-19 | Discharge: 2021-07-19 | Disposition: A | Discharge status: Home / Self Care | Payer: Medicare Other | Source: Ambulatory Visit | Attending: Family Medicine | Admitting: Family Medicine

## 2021-07-19 DIAGNOSIS — K862 Cyst of pancreas: Secondary | ICD-10-CM | POA: Diagnosis not present

## 2021-07-19 DIAGNOSIS — R1900 Intra-abdominal and pelvic swelling, mass and lump, unspecified site: Secondary | ICD-10-CM | POA: Diagnosis not present

## 2021-07-19 DIAGNOSIS — K869 Disease of pancreas, unspecified: Secondary | ICD-10-CM | POA: Diagnosis not present

## 2021-07-19 DIAGNOSIS — I714 Abdominal aortic aneurysm, without rupture, unspecified: Secondary | ICD-10-CM | POA: Diagnosis not present

## 2021-07-19 MED ORDER — GADOBUTROL 1 MMOL/ML IV SOLN
10.0000 mL | Freq: Once | INTRAVENOUS | Status: AC | PRN
  Administered 2021-07-19: 10 mL via INTRAVENOUS

## 2021-07-24 NOTE — Progress Notes (Signed)
I,Sulibeya S Dimas,acting as a Neurosurgeon for Shirlee Latch, MD.,have documented all relevant documentation on the behalf of Shirlee Latch, MD,as directed by  Shirlee Latch, MD while in the presence of Shirlee Latch, MD.   Established patient visit   Patient: Isaac Watkins   DOB: 03-Sep-1940   81 y.o. Male  MRN: [id number] Visit Date: 07/25/2021  Today's healthcare provider: Shirlee Latch, MD   Chief Complaint  Patient presents with   Cough   Subjective    HPI  Follow up for cough  The patient was last seen for this 1 weeks ago. Changes made at last visit include start Augmentin 875 mg BID x 10 days. Refilled albuterol inhaler to use as needed for cough, sob, and wheezing. Rx tussionex to use as needed at night.  Patient reports using inhaler 3-4 times daily. Medrol dose pack completed.  Reports mild symptom control.   He reports excellent compliance with treatment. He feels that condition is Unchanged.  He continues to feel short of breath and wheezy, which is exacerbated by any exertion.  Continues to have significant coughing which is productive He is not having side effects.   -----------------------------------------------------------------------------------------   Medications: Outpatient Medications Prior to Visit  Medication Sig   albuterol (VENTOLIN HFA) 108 (90 Base) MCG/ACT inhaler Inhale 2 puffs into the lungs every 6 (six) hours as needed for wheezing or shortness of breath.   aspirin 81 MG chewable tablet aspirin 81 mg chewable tablet  CHEW AND SWALLOW 1 TABLET BY MOUTH TWICE DAILY   atorvastatin (LIPITOR) 40 MG tablet TAKE 1 TABLET(40 MG) BY MOUTH DAILY AT 6 PM   citalopram (CELEXA) 20 MG tablet TAKE 1 TABLET(20 MG) BY MOUTH DAILY   Docusate Sodium (DSS) 100 MG CAPS docusate sodium 100 mg capsule  TAKE 1 CAPSULE BY MOUTH TWICE DAILY   hydrochlorothiazide (HYDRODIURIL) 25 MG tablet TAKE 1 TABLET(25 MG) BY MOUTH DAILY   losartan (COZAAR)  100 MG tablet Take 1 tablet (100 mg total) by mouth daily.   Oxycodone HCl 10 MG TABS oxycodone 10 mg tablet  TAKE 1 TABLET BY MOUTH EVERY 4 HOURS   TIADYLT ER 300 MG 24 hr capsule TAKE 1 CAPSULE(300 MG) BY MOUTH DAILY   [DISCONTINUED] amoxicillin-clavulanate (AUGMENTIN) 875-125 MG tablet Take 1 tablet by mouth 2 (two) times daily.   [DISCONTINUED] chlorpheniramine-HYDROcodone (TUSSIONEX PENNKINETIC ER) 10-8 MG/5ML Take 5 mLs by mouth at bedtime as needed for cough.   [DISCONTINUED] Tiotropium Bromide Monohydrate (SPIRIVA RESPIMAT) 2.5 MCG/ACT AERS Inhale 2 puffs into the lungs daily.   [DISCONTINUED] methylPREDNISolone (MEDROL DOSEPAK) 4 MG TBPK tablet Take 6 pills on day 1, 5 pills on day 2, 4 pills on day 3, 3 pills on day 4, 2 pills on day 5, 1 pill on day 6 (Patient not taking: Reported on 07/25/2021)   No facility-administered medications prior to visit.    Review of Systems  Constitutional:  Positive for chills, diaphoresis and fever. Negative for appetite change.  HENT:  Positive for congestion, sinus pressure, sinus pain and sneezing. Negative for ear pain, postnasal drip, rhinorrhea and sore throat.   Respiratory:  Positive for shortness of breath and wheezing. Negative for chest tightness.   Cardiovascular:  Negative for chest pain and palpitations.  Neurological:  Positive for dizziness.       Objective    BP (!) 156/88 (BP Location: Left Arm, Patient Position: Sitting, Cuff Size: Large)   Pulse (!) 110   Temp 99.6 F (37.6  C) (Oral)   Resp 20   Wt 236 lb 11.2 oz (107.4 kg)   SpO2 96%   BMI 28.07 kg/m  BP Readings from Last 3 Encounters:  07/25/21 (!) 156/88  07/16/21 (!) 162/93  07/12/21 139/82   Wt Readings from Last 3 Encounters:  07/25/21 236 lb 11.2 oz (107.4 kg)  07/16/21 238 lb (108 kg)  07/12/21 242 lb 14.4 oz (110.2 kg)      Physical Exam Vitals reviewed.  Constitutional:      General: He is not in acute distress.    Appearance: Normal appearance. He  is not diaphoretic.  HENT:     Head: Normocephalic and atraumatic.  Eyes:     General: No scleral icterus.    Conjunctiva/sclera: Conjunctivae normal.  Cardiovascular:     Rate and Rhythm: Normal rate and regular rhythm.     Heart sounds: Normal heart sounds.  Pulmonary:     Effort: Pulmonary effort is normal. No respiratory distress.     Breath sounds: Wheezing (bilateral bases) present. No rhonchi.  Abdominal:     General: There is no distension.     Palpations: Abdomen is soft.     Tenderness: There is no abdominal tenderness.  Musculoskeletal:     Cervical back: Neck supple.     Right lower leg: No edema.     Left lower leg: No edema.  Lymphadenopathy:     Cervical: No cervical adenopathy.  Skin:    General: Skin is warm and dry.  Neurological:     Mental Status: He is alert. Mental status is at baseline.  Psychiatric:        Mood and Affect: Mood normal.        Behavior: Behavior normal.      No results found for any visits on 07/25/21.  Assessment & Plan     1. Chronic obstructive pulmonary disease with acute exacerbation (HCC) -Subsequent encounter - Was previously treated with Medrol, Augmentin, albuterol, cough medication - We will reorder some codeine cough syrup as this has been helpful - Has been using Spiriva for baseline symptom control, but will change to Trelegy at least for the next month - Continue albuterol as needed - Azithromycin and prednisone taper - Get chest x-ray to ensure no pneumonia as he has not been improving - Return precautions discussed - DG Chest 2 View; Future   Meds ordered this encounter  Medications   guaiFENesin-codeine 100-10 MG/5ML syrup    Sig: Take 5 mLs by mouth 3 (three) times daily as needed for cough.    Dispense:  120 mL    Refill:  0   azithromycin (ZITHROMAX) 250 MG tablet    Sig: Take 2 tablets on day 1, then 1 tablet daily on days 2 through 5    Dispense:  6 tablet    Refill:  0   predniSONE (DELTASONE) 10  MG tablet    Sig: Take 60mg  PO daily x1d, then 50mg  daily x1d, then 40mg  daily x1d, then 30mg  daily x1d, then 20mg  daily x1d, then 10mg  daily x1d, then stop    Dispense:  21 tablet    Refill:  0   Fluticasone-Umeclidin-Vilant (TRELEGY ELLIPTA) 100-62.5-25 MCG/ACT AEPB    Sig: Inhale 1 puff into the lungs daily.    Dispense:  1 each    Refill:  11     Return if symptoms worsen or fail to improve.      I, Shirlee LatchAngela Ahyana Skillin, MD, have reviewed all documentation  for this visit. The documentation on 07/25/21 for the exam, diagnosis, procedures, and orders are all accurate and complete.   Tyrah Broers, Marzella Schlein, MD, MPH [facility]

## 2021-07-25 ENCOUNTER — Encounter: Payer: Self-pay | Admitting: Family Medicine

## 2021-07-25 ENCOUNTER — Other Ambulatory Visit: Payer: Self-pay | Admitting: Family Medicine

## 2021-07-25 ENCOUNTER — Ambulatory Visit
Admission: RE | Admit: 2021-07-25 | Discharge: 2021-07-25 | Disposition: A | Discharge status: Home / Self Care | Payer: Medicare Other | Source: Ambulatory Visit | Attending: Family Medicine | Admitting: Family Medicine

## 2021-07-25 ENCOUNTER — Ambulatory Visit (INDEPENDENT_AMBULATORY_CARE_PROVIDER_SITE_OTHER): Payer: Medicare Other | Admitting: Family Medicine

## 2021-07-25 ENCOUNTER — Ambulatory Visit
Admission: RE | Admit: 2021-07-25 | Discharge: 2021-07-25 | Disposition: A | Discharge status: Home / Self Care | Payer: Medicare Other | Attending: Family Medicine | Admitting: Family Medicine

## 2021-07-25 VITALS — BP 156/88 | HR 110 | Temp 99.6°F | Resp 20 | Wt 236.7 lb

## 2021-07-25 DIAGNOSIS — J441 Chronic obstructive pulmonary disease with (acute) exacerbation: Secondary | ICD-10-CM

## 2021-07-25 DIAGNOSIS — J984 Other disorders of lung: Secondary | ICD-10-CM | POA: Diagnosis not present

## 2021-07-25 MED ORDER — PREDNISONE 10 MG PO TABS: ORAL_TABLET | ORAL | 0 refills | Status: DC

## 2021-07-25 MED ORDER — GUAIFENESIN-CODEINE 100-10 MG/5ML PO SOLN: 5.0000 mL | Freq: Three times a day (TID) | ORAL | 0 refills | Status: DC | PRN

## 2021-07-25 MED ORDER — TRELEGY ELLIPTA 100-62.5-25 MCG/ACT IN AEPB: 1.0000 | INHALATION_SPRAY | Freq: Every day | RESPIRATORY_TRACT | 11 refills | Status: DC

## 2021-07-25 MED ORDER — AZITHROMYCIN 250 MG PO TABS: ORAL_TABLET | ORAL | 0 refills | Status: AC

## 2021-08-01 DIAGNOSIS — M1611 Unilateral primary osteoarthritis, right hip: Secondary | ICD-10-CM | POA: Diagnosis not present

## 2021-08-09 NOTE — Progress Notes (Signed)
I,Monish Haliburton S Umberto Pavek,acting as a Education administrator for Lavon Paganini, MD.,have documented all relevant documentation on the behalf of Lavon Paganini, MD,as directed by  Lavon Paganini, MD while in the presence of Lavon Paganini, MD.   Annual Wellness Visit     Patient: Isaac Watkins, Male    DOB: [birth year], 81 y.o.   MRN: [id number] Visit Date: 08/13/2021  Today's Provider: Lavon Paganini, MD   Chief Complaint  Patient presents with   Medicare Wellness   Subjective    Isaac Watkins is a 81 y.o. male who presents today for his Annual Wellness Visit. He reports consuming a general diet. The patient does not participate in regular exercise at present. He generally feels well. He reports sleeping well. He does have additional problems to discuss today.   HPI  Medications: Outpatient Medications Prior to Visit  Medication Sig   albuterol (VENTOLIN HFA) 108 (90 Base) MCG/ACT inhaler Inhale 2 puffs into the lungs every 6 (six) hours as needed for wheezing or shortness of breath.   atorvastatin (LIPITOR) 40 MG tablet TAKE 1 TABLET(40 MG) BY MOUTH DAILY AT 6 PM   citalopram (CELEXA) 20 MG tablet TAKE 1 TABLET(20 MG) BY MOUTH DAILY   Docusate Sodium (DSS) 100 MG CAPS docusate sodium 100 mg capsule  TAKE 1 CAPSULE BY MOUTH TWICE DAILY   Fluticasone-Umeclidin-Vilant (TRELEGY ELLIPTA) 100-62.5-25 MCG/ACT AEPB Inhale 1 puff into the lungs daily.   losartan (COZAAR) 100 MG tablet Take 1 tablet (100 mg total) by mouth daily.   TIADYLT ER 300 MG 24 hr capsule TAKE 1 CAPSULE(300 MG) BY MOUTH DAILY   [DISCONTINUED] hydrochlorothiazide (HYDRODIURIL) 25 MG tablet TAKE 1 TABLET(25 MG) BY MOUTH DAILY   [DISCONTINUED] aspirin 81 MG chewable tablet aspirin 81 mg chewable tablet  CHEW AND SWALLOW 1 TABLET BY MOUTH TWICE DAILY (Patient not taking: Reported on 08/13/2021)   [DISCONTINUED] guaiFENesin-codeine 100-10 MG/5ML syrup Take 5 mLs by mouth 3 (three) times daily as needed for cough.  (Patient not taking: Reported on 08/13/2021)   [DISCONTINUED] Oxycodone HCl 10 MG TABS oxycodone 10 mg tablet  TAKE 1 TABLET BY MOUTH EVERY 4 HOURS (Patient not taking: Reported on 08/13/2021)   [DISCONTINUED] predniSONE (DELTASONE) 10 MG tablet Take 3m PO daily x1d, then 527mdaily x1d, then 4022maily x1d, then 30m62mily x1d, then 20mg61mly x1d, then 10mg 71my x1d, then stop (Patient not taking: Reported on 08/13/2021)   No facility-administered medications prior to visit.    No Known Allergies  Patient Care Team: BacigaVirginia Crewss PCP - General (Family Medicine) [facility]atheDermatology) Bell, Lorelee Cover(Ophthalmology)  Review of Systems  Constitutional:  Positive for fatigue.  Respiratory:  Positive for shortness of breath.   All other systems reviewed and are negative.  Last CBC Lab Results  Component Value Date   WBC 7.5 06/24/2021   HGB 13.3 06/24/2021   HCT 37.9 06/24/2021   MCV 94 06/24/2021   MCH 32.9 06/24/2021   RDW 12.1 06/24/2021   PLT 255 06/25/54/43/3295t metabolic panel Lab Results  Component Value Date   GLUCOSE 107 (H) 06/24/2021   NA 140 06/24/2021   K 4.1 06/24/2021   CL 102 06/24/2021   CO2 23 06/24/2021   BUN 23 06/24/2021   CREATININE 1.17 06/24/2021   EGFR 64 06/24/2021   CALCIUM 9.5 06/24/2021   PROT 7.1 02/07/2020   ALBUMIN 4.2 06/24/2021   LABGLOB 2.5 02/07/2020   AGRATIO  1.8 02/07/2020   BILITOT 0.7 02/07/2020   ALKPHOS 101 02/07/2020   AST 22 02/07/2020   ALT 23 02/07/2020   Last lipids Lab Results  Component Value Date   CHOL 165 02/07/2020   HDL 63 02/07/2020   LDLCALC 83 02/07/2020   TRIG 106 02/07/2020   CHOLHDL 2.6 01/27/2019   Last hemoglobin A1c Lab Results  Component Value Date   HGBA1C 5.5 06/24/2021   Last thyroid functions Lab Results  Component Value Date   TSH 2.97 03/05/2018   Last vitamin B12 and Folate Lab Results  Component Value Date   VITAMINB12 242.2 03/05/2018         Objective    Vitals: BP (!) 88/61 (BP Location: Left Arm, Patient Position: Sitting, Cuff Size: Large)   Pulse 82   Temp 98.1 F (36.7 C) (Oral)   Resp 16   Ht 6' 0.5" (1.842 m)   Wt 226 lb 14.4 oz (102.9 kg)   BMI 30.35 kg/m  BP Readings from Last 3 Encounters:  08/13/21 (!) 88/61  07/25/21 (!) 156/88  07/16/21 (!) 162/93   Wt Readings from Last 3 Encounters:  08/13/21 226 lb 14.4 oz (102.9 kg)  07/25/21 236 lb 11.2 oz (107.4 kg)  07/16/21 238 lb (108 kg)       Physical Exam Vitals reviewed.  Constitutional:      General: He is not in acute distress.    Appearance: Normal appearance. He is well-developed. He is not diaphoretic.  HENT:     Head: Normocephalic and atraumatic.     Right Ear: Tympanic membrane, ear canal and external ear normal.     Left Ear: Tympanic membrane, ear canal and external ear normal.     Nose: Nose normal.     Mouth/Throat:     Mouth: Mucous membranes are moist.     Pharynx: Oropharynx is clear. No oropharyngeal exudate.  Eyes:     General: No scleral icterus.    Conjunctiva/sclera: Conjunctivae normal.     Pupils: Pupils are equal, round, and reactive to light.  Neck:     Thyroid: No thyromegaly.  Cardiovascular:     Rate and Rhythm: Normal rate and regular rhythm.     Pulses: Normal pulses.     Heart sounds: Normal heart sounds. No murmur heard. Pulmonary:     Effort: Pulmonary effort is normal. No respiratory distress.     Breath sounds: Normal breath sounds. No wheezing or rales.  Abdominal:     General: There is no distension.     Palpations: Abdomen is soft.     Tenderness: There is no abdominal tenderness.  Musculoskeletal:        General: No deformity.     Cervical back: Neck supple.     Right lower leg: No edema.     Left lower leg: No edema.  Lymphadenopathy:     Cervical: No cervical adenopathy.  Skin:    General: Skin is warm and dry.     Findings: No rash.  Neurological:     Mental Status: He is alert and oriented  to person, place, and time. Mental status is at baseline.     Gait: Gait normal.  Psychiatric:        Mood and Affect: Mood normal.        Behavior: Behavior normal.        Thought Content: Thought content normal.     Most recent functional status assessment:    08/13/2021  8:49 AM  In your present state of health, do you have any difficulty performing the following activities:  Hearing? 1  Vision? 0  Difficulty concentrating or making decisions? 0  Walking or climbing stairs? 1  Dressing or bathing? 0  Doing errands, shopping? 0   Most recent fall risk assessment:    08/13/2021    8:49 AM  Fall Risk   Falls in the past year? 0  Number falls in past yr: 0  Injury with Fall? 0  Risk for fall due to : No Fall Risks  Follow up Falls evaluation completed    Most recent depression screenings:    08/13/2021    8:49 AM 06/24/2021   10:34 AM  PHQ 2/9 Scores  PHQ - 2 Score 0 0  PHQ- 9 Score 2 0   Most recent cognitive screening:    08/13/2021    8:47 AM  6CIT Screen  What Year? 0 points  What month? 0 points  What time? 0 points  Count back from 20 0 points  Months in reverse 0 points  Repeat phrase 0 points  Total Score 0 points   Most recent Audit-C alcohol use screening    08/13/2021    8:49 AM  Alcohol Use Disorder Test (AUDIT)  1. How often do you have a drink containing alcohol? 4  2. How many drinks containing alcohol do you have on a typical day when you are drinking? 1  3. How often do you have six or more drinks on one occasion? 0  AUDIT-C Score 5  4. How often during the last year have you found that you were not able to stop drinking once you had started? 0  5. How often during the last year have you failed to do what was normally expected from you because of drinking? 0  6. How often during the last year have you needed a first drink in the morning to get yourself going after a heavy drinking session? 0  7. How often during the last year have you had a  feeling of guilt of remorse after drinking? 0  8. How often during the last year have you been unable to remember what happened the night before because you had been drinking? 0  9. Have you or someone else been injured as a result of your drinking? 0  10. Has a relative or friend or a doctor or another health worker been concerned about your drinking or suggested you cut down? 0  Alcohol Use Disorder Identification Test Final Score (AUDIT) 5   A score of 3 or more in women, and 4 or more in men indicates increased risk for alcohol abuse, EXCEPT if all of the points are from question 1   No results found for any visits on 08/13/21.  Assessment & Plan     Annual wellness visit done today including the all of the following: Reviewed patient's Family Medical History Reviewed and updated list of patient's medical providers Assessment of cognitive impairment was done Assessed patient's functional ability Established a written schedule for health screening [place] Completed and Reviewed  Exercise Activities and Dietary recommendations  Goals      DIET - INCREASE WATER INTAKE     Recommend to drink at least 6-8 8oz glasses of water per day.        Immunization History  Administered Date(s) Administered   Fluad Quad(high Dose 65+) 01/26/2019   Hepatitis A, Adult 01/13/2011, 07/10/2011  Influenza, High Dose Seasonal PF 01/07/2020   PFIZER(Purple Top)SARS-COV-2 Vaccination 03/21/2019, 04/11/2019   Pneumococcal Conjugate-13 10/20/2013   Pneumococcal Polysaccharide-23 02/08/2009, 11/22/2014, 01/10/2018   Tdap 01/03/2016   Zoster Recombinat (Shingrix) 03/19/2017, 08/20/2017   Zoster, Live 08/12/2008    Health Maintenance  Topic Date Due   COVID-19 Vaccine (3 - Booster for Pfizer series) 06/06/2019   INFLUENZA VACCINE  10/08/2021   TETANUS/TDAP  01/02/2026   Pneumonia Vaccine 27+ Years old  Completed   Zoster Vaccines- Shingrix  Completed   HPV VACCINES   Aged Out     Discussed health benefits of physical activity, and encouraged him to engage in regular exercise appropriate for his age and condition.    Problem List Items Addressed This Visit       Cardiovascular and Mediastinum   Hypertension    BP low and patient reports fatigue Continue losartan at current dose Decrease hctz to 12.43m daily Patient to let me know if BPs are better or if further dose titration needed Recheck metabolic panel F/u in 6 months       Relevant Medications   hydrochlorothiazide (HYDRODIURIL) 12.5 MG tablet   Other Relevant Orders   Comprehensive metabolic panel   Infrarenal abdominal aortic aneurysm (AAA) without rupture ([place]    Noted incidentally on MRI abd Needs q2 yr [place] ensure no growth - next in 2025       Relevant Medications   hydrochlorothiazide (HYDRODIURIL) 12.5 MG tablet     Digestive   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 Needs MRI with MRCP in 6 months (will order and schedule for 11/23)       Relevant Orders   MR ABDOMEN MRCP W WO CONTAST     Other   Obesity    Discussed importance of healthy weight management Discussed diet and exercise       Relevant Orders   Comprehensive metabolic panel   Lipid Panel With LDL/HDL Ratio   Pure hypercholesterolemia    Previously well controlled Continue statin Repeat FLP and CMP      Relevant Medications   hydrochlorothiazide (HYDRODIURIL) 12.5 MG tablet   Other Relevant Orders   Lipid Panel With LDL/HDL Ratio   Other Visit Diagnoses     Encounter for annual wellness visit (AWV) in Medicare patient    -  Primary   Encounter for annual physical exam       Relevant Orders   Comprehensive metabolic panel   Lipid Panel With LDL/HDL Ratio        Return in about 6 months (around 02/12/2022) for chronic disease f/u.     I, ALavon Paganini MD, have reviewed all documentation for this visit. The documentation on 08/13/21 for the exam, diagnosis,  procedures, and orders are all accurate and complete.   , ADionne Bucy MD, MPH [facility]

## 2021-08-13 ENCOUNTER — Ambulatory Visit (INDEPENDENT_AMBULATORY_CARE_PROVIDER_SITE_OTHER): Payer: Medicare Other | Admitting: Family Medicine

## 2021-08-13 ENCOUNTER — Encounter: Payer: Self-pay | Admitting: Family Medicine

## 2021-08-13 VITALS — BP 88/61 | HR 82 | Temp 98.1°F | Resp 16 | Ht 72.5 in | Wt 226.9 lb

## 2021-08-13 DIAGNOSIS — Z6830 Body mass index (BMI) 30.0-30.9, adult: Secondary | ICD-10-CM

## 2021-08-13 DIAGNOSIS — M1611 Unilateral primary osteoarthritis, right hip: Secondary | ICD-10-CM | POA: Diagnosis not present

## 2021-08-13 DIAGNOSIS — I10 Essential (primary) hypertension: Secondary | ICD-10-CM | POA: Diagnosis not present

## 2021-08-13 DIAGNOSIS — Z0000 Encounter for general adult medical examination without abnormal findings: Secondary | ICD-10-CM | POA: Diagnosis not present

## 2021-08-13 DIAGNOSIS — Z96641 Presence of right artificial hip joint: Secondary | ICD-10-CM | POA: Diagnosis not present

## 2021-08-13 DIAGNOSIS — I7143 Infrarenal abdominal aortic aneurysm, without rupture: Secondary | ICD-10-CM

## 2021-08-13 DIAGNOSIS — E7800 Pure hypercholesterolemia, unspecified: Secondary | ICD-10-CM

## 2021-08-13 DIAGNOSIS — E669 Obesity, unspecified: Secondary | ICD-10-CM

## 2021-08-13 DIAGNOSIS — K862 Cyst of pancreas: Secondary | ICD-10-CM

## 2021-08-13 MED ORDER — HYDROCHLOROTHIAZIDE 12.5 MG PO TABS: 12.5000 mg | ORAL_TABLET | Freq: Every day | ORAL | 1 refills | Status: DC

## 2021-08-13 NOTE — Assessment & Plan Note (Signed)
Discussed importance of healthy weight management Discussed diet and exercise  

## 2021-08-13 NOTE — Assessment & Plan Note (Signed)
Noted incidentally on MRI abd Needs q2 yr [place] to ensure no growth - next in 2025

## 2021-08-13 NOTE — Assessment & Plan Note (Signed)
BP low and patient reports fatigue Continue losartan at current dose Decrease hctz to 12.5mg  daily Patient to let me know if BPs are better or if further dose titration needed Recheck metabolic panel F/u in 6 months

## 2021-08-13 NOTE — Assessment & Plan Note (Signed)
Noted on MRI abdomen Likely cystic Needs MRI with MRCP in 6 months (will order and schedule for 11/23)

## 2021-08-13 NOTE — Assessment & Plan Note (Signed)
Previously well controlled Continue statin Repeat FLP and CMP  

## 2021-08-14 LAB — COMPREHENSIVE METABOLIC PANEL
ALT: 23 IU/L (ref 0–44)
AST: 21 IU/L (ref 0–40)
Albumin/Globulin Ratio: 1.6 (ref 1.2–2.2)
Albumin: 4.2 g/dL (ref 3.7–4.7)
Alkaline Phosphatase: 102 IU/L (ref 44–121)
BUN/Creatinine Ratio: 26 — ABNORMAL HIGH (ref 10–24)
BUN: 29 mg/dL — ABNORMAL HIGH (ref 8–27)
Bilirubin Total: 0.7 mg/dL (ref 0.0–1.2)
CO2: 21 mmol/L (ref 20–29)
Calcium: 9.6 mg/dL (ref 8.6–10.2)
Chloride: 102 mmol/L (ref 96–106)
Creatinine, Ser: 1.11 mg/dL (ref 0.76–1.27)
Globulin, Total: 2.6 g/dL (ref 1.5–4.5)
Glucose: 100 mg/dL — ABNORMAL HIGH (ref 70–99)
Potassium: 4.1 mmol/L (ref 3.5–5.2)
Sodium: 139 mmol/L (ref 134–144)
Total Protein: 6.8 g/dL (ref 6.0–8.5)
eGFR: 68 mL/min/{1.73_m2} (ref >59)

## 2021-08-14 LAB — LIPID PANEL WITH LDL/HDL RATIO
Cholesterol, Total: 162 mg/dL (ref 100–199)
HDL: 49 mg/dL (ref >39)
LDL Chol Calc (NIH): 93 mg/dL (ref 0–99)
LDL/HDL Ratio: 1.9 ratio (ref 0.0–3.6)
Triglycerides: 110 mg/dL (ref 0–149)
VLDL Cholesterol Cal: 20 mg/dL (ref 5–40)

## 2021-08-15 DIAGNOSIS — M1611 Unilateral primary osteoarthritis, right hip: Secondary | ICD-10-CM | POA: Diagnosis not present

## 2021-08-20 DIAGNOSIS — M1611 Unilateral primary osteoarthritis, right hip: Secondary | ICD-10-CM | POA: Diagnosis not present

## 2021-08-22 DIAGNOSIS — M1611 Unilateral primary osteoarthritis, right hip: Secondary | ICD-10-CM | POA: Diagnosis not present

## 2021-08-26 DIAGNOSIS — M1611 Unilateral primary osteoarthritis, right hip: Secondary | ICD-10-CM | POA: Diagnosis not present

## 2021-08-28 DIAGNOSIS — M1611 Unilateral primary osteoarthritis, right hip: Secondary | ICD-10-CM | POA: Diagnosis not present

## 2021-09-08 ENCOUNTER — Other Ambulatory Visit: Payer: Self-pay | Admitting: Family Medicine

## 2021-09-11 ENCOUNTER — Other Ambulatory Visit: Payer: Self-pay | Admitting: Family Medicine

## 2021-09-16 ENCOUNTER — Other Ambulatory Visit: Payer: Self-pay | Admitting: Family Medicine

## 2021-09-18 ENCOUNTER — Other Ambulatory Visit: Payer: Self-pay | Admitting: Family Medicine

## 2021-09-20 ENCOUNTER — Telehealth: Payer: Self-pay | Admitting: Family Medicine

## 2021-09-20 MED ORDER — LOSARTAN POTASSIUM 100 MG PO TABS: 100.0000 mg | ORAL_TABLET | Freq: Every day | ORAL | 1 refills | Status: DC

## 2021-09-20 NOTE — Telephone Encounter (Signed)
[facility] faxed refill request for the following medications:   losartan (COZAAR) 100 MG tablet    Please advise.  

## 2021-12-03 ENCOUNTER — Other Ambulatory Visit: Payer: Self-pay | Admitting: Physician Assistant

## 2021-12-03 ENCOUNTER — Ambulatory Visit: Payer: Self-pay

## 2021-12-03 DIAGNOSIS — U071 COVID-19: Secondary | ICD-10-CM

## 2021-12-03 MED ORDER — MOLNUPIRAVIR EUA 200MG CAPSULE: 4.0000 | ORAL_CAPSULE | Freq: Two times a day (BID) | ORAL | 0 refills | Status: AC

## 2021-12-03 NOTE — Telephone Encounter (Signed)
    Chief Complaint: Cough, sore throat. COVID positive. Asking for anti-viral Symptoms: Above Frequency: 5 days ago Pertinent Negatives: Patient denies fever Disposition: [] ED /[] Urgent Care (no appt availability in office) / [] Appointment(In office/virtual)/ []  [facility]/ [] Home Care/ [] Refused Recommended Disposition /[] [facility]/ [x]  Follow-up with PCP Additional Notes: Advise pt.  Answer Assessment - Initial Assessment Questions 1. COVID-19 DIAGNOSIS: "How do you know that you have COVID?" (e.g., positive lab test or self-test, diagnosed by doctor or NP/PA, symptoms after exposure).     hOME test 2. COVID-19 EXPOSURE: "Was there any known exposure to COVID before the symptoms began?" CDC Definition of close contact: within 6 feet (2 meters) for a total of 15 minutes or more over a 24-hour period.      No 3. ONSET: "When did the COVID-19 symptoms start?"      5 days ago 4. WORST SYMPTOM: "What is your worst symptom?" (e.g., cough, fever, shortness of breath, muscle aches)     Cough 5. COUGH: "Do you have a cough?" If Yes, ask: "How bad is the cough?"       Yes 6. FEVER: "Do you have a fever?" If Yes, ask: "What is your temperature, how was it measured, and when did it start?"     No 7. RESPIRATORY STATUS: "Describe your breathing?" (e.g., normal; shortness of breath, wheezing, unable to speak)      Mild SOB 8. BETTER-SAME-WORSE: "Are you getting better, staying the same or getting worse compared to yesterday?"  If getting worse, ask, "In what way?"     Better 9. OTHER SYMPTOMS: "Do you have any other symptoms?"  (e.g., chills, fatigue, headache, loss of smell or taste, muscle pain, sore throat)     Headache, sore throat 10. HIGH RISK DISEASE: "Do you have any chronic medical problems?" (e.g., asthma, heart or lung disease, weak immune system, obesity, etc.)       Yes 11. VACCINE: "Have you had the COVID-19 vaccine?" If Yes, ask: "Which one, how many shots,  when did you get it?"       N/a 12. PREGNANCY: "Is there any chance you are pregnant?" "When was your last menstrual period?"       N/a 13. O2 SATURATION MONITOR:  "Do you use an oxygen saturation monitor (pulse oximeter) at home?" If Yes, ask "What is your reading (oxygen level) today?" "What is your usual oxygen saturation reading?" (e.g., 95%)       No  Protocols used: Coronavirus (COVID-19) Diagnosed or Suspected-A-AH

## 2021-12-03 NOTE — Telephone Encounter (Signed)
Mychart visit scheduled for tomorrow morning at 8:20 am

## 2021-12-04 ENCOUNTER — Telehealth (INDEPENDENT_AMBULATORY_CARE_PROVIDER_SITE_OTHER): Payer: Medicare Other | Admitting: Physician Assistant

## 2021-12-04 ENCOUNTER — Encounter: Payer: Self-pay | Admitting: Physician Assistant

## 2021-12-04 DIAGNOSIS — U071 COVID-19: Secondary | ICD-10-CM | POA: Diagnosis not present

## 2021-12-04 NOTE — Progress Notes (Signed)
MyChart Video Visit    Virtual Visit via Video Note   This visit type was conducted due to national recommendations for restrictions regarding the COVID-19 Pandemic (e.g. social distancing) in an effort to limit this patient's exposure and mitigate transmission in our community. This patient is at least at moderate risk for complications without adequate follow up. This format is felt to be most appropriate for this patient at this time. Physical exam was limited by quality of the video and audio technology used for the visit.   Patient location: Home Provider location: [facility]  I discussed the limitations of evaluation and management by telemedicine and the availability of in person appointments. The patient expressed understanding and agreed to proceed.  Patient: Isaac Watkins   DOB: [birth year]   81 y.o. Male  MRN: [id number] Visit Date: 12/04/2021  Today's healthcare provider: Alfredia Ferguson, PA-C   Cc. Covid 19 + at home  Subjective    HPI   Pt reports symptoms of headache, sinus congestion, sore throat, cough, fever, chills, body aches started 6 days ago, tested positive for covid yesterday on a home rapid test. His wife developed the same symptoms a few days later.  Antiviral med had been sent in yesterday after he called-- that would have been day 5. Pt reports not picking it up as he feels much better-- no fevers, all symptoms have lessened. Using corcidin, tylenol and chloroptic spray otc.  Medications: Outpatient Medications Prior to Visit  Medication Sig   albuterol (VENTOLIN HFA) 108 (90 Base) MCG/ACT inhaler Inhale 2 puffs into the lungs every 6 (six) hours as needed for wheezing or shortness of breath.   atorvastatin (LIPITOR) 40 MG tablet TAKE 1 TABLET(40 MG) BY MOUTH DAILY AT 6 PM   citalopram (CELEXA) 20 MG tablet TAKE 1 TABLET(20 MG) BY MOUTH DAILY   Docusate Sodium (DSS) 100 MG CAPS docusate sodium 100 mg capsule  TAKE 1 CAPSULE BY  MOUTH TWICE DAILY   Fluticasone-Umeclidin-Vilant (TRELEGY ELLIPTA) 100-62.5-25 MCG/ACT AEPB Inhale 1 puff into the lungs daily.   hydrochlorothiazide (HYDRODIURIL) 12.5 MG tablet Take 1 tablet (12.5 mg total) by mouth daily.   losartan (COZAAR) 100 MG tablet Take 1 tablet (100 mg total) by mouth daily.   molnupiravir EUA (LAGEVRIO) 200 mg CAPS capsule Take 4 capsules (800 mg total) by mouth 2 (two) times daily for 5 days.   TIADYLT ER 300 MG 24 hr capsule TAKE 1 CAPSULE(300 MG) BY MOUTH DAILY   No facility-administered medications prior to visit.    Review of Systems  Constitutional:  Positive for fatigue and fever.  HENT:  Positive for congestion, rhinorrhea and sore throat.   Respiratory:  Positive for cough. Negative for shortness of breath and wheezing.   Cardiovascular:  Negative for chest pain and leg swelling.  Neurological:  Positive for headaches.       Objective    There were no vitals taken for this visit.    Physical Exam Constitutional:      General: He is not in acute distress.    Appearance: He is not ill-appearing.  Neurological:     Mental Status: He is oriented to person, place, and time.      Assessment & Plan     COVID 19 URI Continue otc therapy, fluids, rest.  Advised if he is feeling better he may not need to take the antiviral medication -- pt agrees  Pt to call office for worsening symptoms-- chest pain, sob,  return of fever.  Return if symptoms worsen or fail to improve.     I discussed the assessment and treatment plan with the patient. The patient was provided an opportunity to ask questions and all were answered. The patient agreed with the plan and demonstrated an understanding of the instructions.   The patient was advised to call back or seek an in-person evaluation if the symptoms worsen or if the condition fails to improve as anticipated.  I provided 7 minutes of non-face-to-face time during this encounter.  I, Mikey Kirschner, PA-C  have reviewed all documentation for this visit. The documentation on  12/04/2021  for the exam, diagnosis, procedures, and orders are all accurate and complete.  Mikey Kirschner, PA-C [facility] [address] [place], [place], [address] Office: [phone] Fax: [place]

## 2021-12-22 ENCOUNTER — Other Ambulatory Visit: Payer: Self-pay | Admitting: Family Medicine

## 2022-01-02 ENCOUNTER — Ambulatory Visit: Payer: Self-pay | Admitting: *Deleted

## 2022-01-02 NOTE — Telephone Encounter (Signed)
  Chief Complaint: shortness of breath since hip replacement and having Covid is getting progressively worse.    Frequent urination during the night Symptoms: frequent urination 7-8 times/night.    Shortness of breath with exertion only Frequency: Both of these have been progressing Pertinent Negatives: Patient denies burning with urination.   Disposition: [] ED /[] Urgent Care (no appt availability in office) / [x] Appointment(In office/virtual)/ []  [facility]/ [] Home Care/ [] Refused Recommended Disposition /[] [facility]/ []  Follow-up with PCP Additional Notes: Appt made with Dr. Brita Romp for 01/14/2022 at 8:20.

## 2022-01-02 NOTE — Telephone Encounter (Signed)
Reason for Disposition  [1] MODERATE longstanding difficulty breathing (e.g., speaks in phrases, SOB even at rest, pulse 100-120) AND [2] SAME as normal  Answer Assessment - Initial Assessment Questions 1. RESPIRATORY STATUS: "Describe your breathing?" (e.g., wheezing, shortness of breath, unable to speak, severe coughing)      Shortness of breath, tired, going too restroom 7-8 times a night and getting worse.    Takes a while to start stream.   No burning.    2. ONSET: "When did this breathing problem begin?"      Gotten worse.   I had hip replaced and Covid it's gotten worse since then.    3. PATTERN "Does the difficult breathing come and go, or has it been constant since it started?"      Shortness of breath with exertion 4. SEVERITY: "How bad is your breathing?" (e.g., mild, moderate, severe)    - MILD: No SOB at rest, mild SOB with walking, speaks normally in sentences, can lie down, no retractions, pulse < 100.    - MODERATE: SOB at rest, SOB with minimal exertion and prefers to sit, cannot lie down flat, speaks in phrases, mild retractions, audible wheezing, pulse 100-120.    - SEVERE: Very SOB at rest, speaks in single words, struggling to breathe, sitting hunched forward, retractions, pulse > 120      Moderate 5. RECURRENT SYMPTOM: "Have you had difficulty breathing before?" If Yes, ask: "When was the last time?" and "What happened that time?"      Yes 6. CARDIAC HISTORY: "Do you have any history of heart disease?" (e.g., heart attack, angina, bypass surgery, angioplasty)      Not asked 7. LUNG HISTORY: "Do you have any history of lung disease?"  (e.g., pulmonary embolus, asthma, emphysema)     emphysema 8. CAUSE: "What do you think is causing the breathing problem?"      It's getting worse the shortness of breath 9. OTHER SYMPTOMS: "Do you have any other symptoms? (e.g., dizziness, runny nose, cough, chest pain, fever)     Frequent urination   10. O2 SATURATION MONITOR:  "Do you  use an oxygen saturation monitor (pulse oximeter) at home?" If Yes, ask: "What is your reading (oxygen level) today?" "What is your usual oxygen saturation reading?" (e.g., 95%)       N/A 11. PREGNANCY: "Is there any chance you are pregnant?" "When was your last menstrual period?"       N/A 12. TRAVEL: "Have you traveled out of the country in the last month?" (e.g., travel history, exposures)       N/A  Protocols used: Breathing Difficulty-A-AH

## 2022-01-06 DIAGNOSIS — H16141 Punctate keratitis, right eye: Secondary | ICD-10-CM | POA: Diagnosis not present

## 2022-01-07 DIAGNOSIS — H16141 Punctate keratitis, right eye: Secondary | ICD-10-CM | POA: Diagnosis not present

## 2022-01-13 NOTE — Progress Notes (Unsigned)
I,Joseline E Rosas,acting as a scribe for Lavon Paganini, MD.,have documented all relevant documentation on the behalf of Lavon Paganini, MD,as directed by  Lavon Paganini, MD while in the presence of Lavon Paganini, MD.   Established patient visit   Patient: Isaac Watkins   DOB: 09/16/1940   81 y.o. Male  MRN: [id number] Visit Date: 01/14/2022  Today's healthcare provider: Lavon Paganini, MD   Chief Complaint  Patient presents with   Shortness of Breath   Urinary Symptoms   Subjective    Shortness of Breath This is a recurrent problem. The current episode started more than 1 month ago. The problem occurs daily. The problem has been gradually worsening. Associated symptoms include rhinorrhea, a sore throat and wheezing. Pertinent negatives include no abdominal pain, chest pain, ear pain, fever or headaches. The symptoms are aggravated by any activity. Associated symptoms comments: Patient had covid six weeks ago. The patient has no known risk factors for DVT/PE. He has tried nothing for the symptoms.    Since having PNA after hip replacement in 4/23 - continues to worsen.  DOE.  Has to ride the motorized cart in the grocery store. Sinus drainage right now with some wheeze, but not the whole time.  No fever.  No chest pain or LE edema.   Urinary symptoms  He reports chronic urinary frequency and urinary hesitancy. The current episode started  6 months ago and is gradually worsening. Patient states symptoms are moderate in intensity, occurring  every night, gets up 6 times . He  has not been recently treated for similar symptoms.    Associated symptoms: No abdominal pain No back pain  No chills No constipation-goes every 2 to 3 days  No cramping No diarrhea  No discharge No fever  No hematuria No nausea  No vomiting    ---------------------------------------------------------------------------------------   Medications: Outpatient Medications Prior to  Visit  Medication Sig   albuterol (VENTOLIN HFA) 108 (90 Base) MCG/ACT inhaler Inhale 2 puffs into the lungs every 6 (six) hours as needed for wheezing or shortness of breath.   atorvastatin (LIPITOR) 40 MG tablet TAKE 1 TABLET(40 MG) BY MOUTH DAILY AT 6 PM   citalopram (CELEXA) 20 MG tablet TAKE 1 TABLET(20 MG) BY MOUTH DAILY   Docusate Sodium (DSS) 100 MG CAPS docusate sodium 100 mg capsule  TAKE 1 CAPSULE BY MOUTH TWICE DAILY   hydrochlorothiazide (HYDRODIURIL) 12.5 MG tablet Take 1 tablet (12.5 mg total) by mouth daily.   losartan (COZAAR) 100 MG tablet Take 1 tablet (100 mg total) by mouth daily.   TIADYLT ER 300 MG 24 hr capsule TAKE 1 CAPSULE(300 MG) BY MOUTH DAILY   [DISCONTINUED] Fluticasone-Umeclidin-Vilant (TRELEGY ELLIPTA) 100-62.5-25 MCG/ACT AEPB Inhale 1 puff into the lungs daily.   No facility-administered medications prior to visit.    Review of Systems  Constitutional:  Negative for fever.  HENT:  Positive for rhinorrhea and sore throat. Negative for ear pain.   Respiratory:  Positive for shortness of breath and wheezing.   Cardiovascular:  Negative for chest pain.  Gastrointestinal:  Negative for abdominal pain.  Neurological:  Negative for headaches.       Objective    BP 126/78 (BP Location: Left Arm, Patient Position: Sitting, Cuff Size: Large)   Pulse 77   Temp 98.1 F (36.7 C) (Oral)   Resp 16   Ht 6' (1.829 m)   Wt 234 lb 11.2 oz (106.5 kg)   SpO2 97%   BMI  31.83 kg/m    Physical Exam Vitals reviewed.  Constitutional:      General: He is not in acute distress.    Appearance: Normal appearance. He is not diaphoretic.  HENT:     Head: Normocephalic and atraumatic.  Eyes:     General: No scleral icterus.    Conjunctiva/sclera: Conjunctivae normal.  Cardiovascular:     Rate and Rhythm: Normal rate and regular rhythm.     Pulses: Normal pulses.     Heart sounds: Normal heart sounds. No murmur heard. Pulmonary:     Effort: Pulmonary effort is  normal. No respiratory distress.     Breath sounds: Wheezing (faint) present. No rhonchi.  Musculoskeletal:     Cervical back: Neck supple.     Right lower leg: No edema.     Left lower leg: No edema.  Lymphadenopathy:     Cervical: No cervical adenopathy.  Skin:    General: Skin is warm and dry.     Findings: No rash.  Neurological:     Mental Status: He is alert and oriented to person, place, and time. Mental status is at baseline.  Psychiatric:        Mood and Affect: Mood normal.        Behavior: Behavior normal.       Results for orders placed or performed in visit on 01/14/22  POCT urinalysis dipstick  Result Value Ref Range   Color, UA Yellow    Clarity, UA Clear    Glucose, UA Negative Negative   Bilirubin, UA Negative    Ketones, UA Negative    Spec Grav, UA >=1.030 (A) 1.010 - 1.025   Blood, UA Negative    pH, UA 6.0 5.0 - 8.0   Protein, UA Negative Negative   Urobilinogen, UA 0.2 0.2 or 1.0 E.U./dL   Nitrite, UA Negative    Leukocytes, UA Negative Negative   Appearance     Odor      Assessment & Plan     Problem List Items Addressed This Visit       Respiratory   Pulmonary emphysema (HCC)    No current exacerbation Needs to resume trelegy for baseline control though      Relevant Medications   Fluticasone-Umeclidin-Vilant (TRELEGY ELLIPTA) 100-62.5-25 MCG/ACT AEPB     Other   BPH associated with nocturia - Primary    Recurrent issue Symptoms consistent with BPH UA normal Tried Flomax in the past, but only for about a week Encouraged patient to take consistently for at least one month to see if there is any improvement Start flomax 0.4mg  daily  Can titrate to 0.8 mg daily at follow up if needed      Relevant Orders   PSA Total (Reflex To Free)   DOE (dyspnea on exertion)    New subacute problem x6-7 months Has faint wheeze today and needs to resume treelegy, but main problem is DOE that is new Needs cardiac workup to rule out atypical  angina Referral placed today Return precautoons discussed      Relevant Orders   Ambulatory referral to Cardiology   Other Visit Diagnoses     Frequent urination at night       Relevant Orders   POCT urinalysis dipstick (Completed)   PSA Total (Reflex To Free)   Need for immunization against influenza       Relevant Orders   Flu Vaccine QUAD High Dose(Fluad) (Completed)        Return in  about 5 weeks (around 02/18/2022) for chronic disease f/u, as scheduled.      I, Shirlee Latch, MD, have reviewed all documentation for this visit. The documentation on 01/14/22 for the exam, diagnosis, procedures, and orders are all accurate and complete.   Bacigalupo, Marzella Schlein, MD, MPH [facility]

## 2022-01-14 ENCOUNTER — Encounter: Payer: Self-pay | Admitting: Family Medicine

## 2022-01-14 ENCOUNTER — Ambulatory Visit (INDEPENDENT_AMBULATORY_CARE_PROVIDER_SITE_OTHER): Payer: Medicare Other | Admitting: Family Medicine

## 2022-01-14 VITALS — BP 126/78 | HR 77 | Temp 98.1°F | Resp 16 | Ht 72.0 in | Wt 234.7 lb

## 2022-01-14 DIAGNOSIS — R0609 Other forms of dyspnea: Secondary | ICD-10-CM | POA: Diagnosis not present

## 2022-01-14 DIAGNOSIS — N401 Enlarged prostate with lower urinary tract symptoms: Secondary | ICD-10-CM | POA: Diagnosis not present

## 2022-01-14 DIAGNOSIS — Z23 Encounter for immunization: Secondary | ICD-10-CM

## 2022-01-14 DIAGNOSIS — R351 Nocturia: Secondary | ICD-10-CM

## 2022-01-14 DIAGNOSIS — J439 Emphysema, unspecified: Secondary | ICD-10-CM

## 2022-01-14 LAB — POCT URINALYSIS DIPSTICK
Bilirubin, UA: NEGATIVE
Blood, UA: NEGATIVE
Glucose, UA: NEGATIVE
Ketones, UA: NEGATIVE
Leukocytes, UA: NEGATIVE
Nitrite, UA: NEGATIVE
Protein, UA: NEGATIVE
Spec Grav, UA: >=1.03 — AB (ref 1.010–1.025)
Urobilinogen, UA: 0.2 E.U./dL
pH, UA: 6 (ref 5.0–8.0)

## 2022-01-14 MED ORDER — TRELEGY ELLIPTA 100-62.5-25 MCG/ACT IN AEPB: 1.0000 | INHALATION_SPRAY | Freq: Every day | RESPIRATORY_TRACT | 11 refills | Status: DC

## 2022-01-14 MED ORDER — TAMSULOSIN HCL 0.4 MG PO CAPS: 0.4000 mg | ORAL_CAPSULE | Freq: Every day | ORAL | 3 refills | Status: DC

## 2022-01-14 NOTE — Assessment & Plan Note (Signed)
No current exacerbation Needs to resume trelegy for baseline control though

## 2022-01-14 NOTE — Assessment & Plan Note (Signed)
Recurrent issue Symptoms consistent with BPH UA normal Tried Flomax in the past, but only for about a week Encouraged patient to take consistently for at least one month to see if there is any improvement Start flomax 0.4mg  daily  Can titrate to 0.8 mg daily at follow up if needed

## 2022-01-14 NOTE — Assessment & Plan Note (Signed)
New subacute problem x6-7 months Has faint wheeze today and needs to resume treelegy, but main problem is DOE that is new Needs cardiac workup to rule out atypical angina Referral placed today Return precautoons discussed

## 2022-01-15 ENCOUNTER — Telehealth: Payer: Self-pay | Admitting: Family Medicine

## 2022-01-15 LAB — PSA TOTAL (REFLEX TO FREE): Prostate Specific Ag, Serum: 3.9 ng/mL (ref 0.0–4.0)

## 2022-01-15 NOTE — Telephone Encounter (Signed)
Please advise 

## 2022-01-15 NOTE — Telephone Encounter (Signed)
Pt states that he was seen by you for SOB and states that you referred him to cardiology.  He would like to know how much he should exert himself.  He stated yesterday he rolled his trash can from the garage to the end of the driveway which is not very far and had to lean against something to catch his breath.  He is wondering if he should do that much.  Please advise.

## 2022-01-16 ENCOUNTER — Ambulatory Visit: Payer: Self-pay | Admitting: *Deleted

## 2022-01-16 NOTE — Telephone Encounter (Signed)
Does he want to see if we can get him in with a local cardiologist before then?

## 2022-01-16 NOTE — Telephone Encounter (Signed)
Lmtcb. PEC please advise when patient calls back.

## 2022-01-16 NOTE — Telephone Encounter (Signed)
Pt returned call and was given the message from Dr. Beryle Flock dated 01/16/2022 at 3:00 PM.  Dr. Beryle Flock referred him to [facility] to a cardiologist but they can't see him until Dec. 19.  He mentioned if he keeps feeling this way he may go on the the ED and let them check him out.    I passed this information on to Dr. Beryle Flock. Reason for Disposition . [1] Follow-up call to recent contact AND [2] information only call, no triage required  Answer Assessment - Initial Assessment Questions 1. REASON FOR CALL or QUESTION: "What is your reason for calling today?" or "How can I best help you?" or "What question do you have that I can help answer?"     Read him the message from Dr. Beryle Flock dated 01/16/2022 at 3:00 PM.  Protocols used: Information Only Call - No Triage-A-AH

## 2022-01-16 NOTE — Telephone Encounter (Signed)
He needs to listen to his body and take it easy.  If it makes him that SOB, he should break it up into pieces to complete the task or consider whether someone else could do it for now

## 2022-01-16 NOTE — Telephone Encounter (Signed)
Patient advised as below. He reports he will go to ED to be evaluated.

## 2022-01-20 ENCOUNTER — Other Ambulatory Visit: Payer: Self-pay

## 2022-01-20 ENCOUNTER — Encounter: Payer: Self-pay | Admitting: Emergency Medicine

## 2022-01-20 ENCOUNTER — Emergency Department: Payer: Medicare Other

## 2022-01-20 ENCOUNTER — Emergency Department
Admission: EM | Admit: 2022-01-20 | Discharge: 2022-01-20 | Disposition: A | Discharge status: Home / Self Care | Payer: Medicare Other | Attending: Emergency Medicine | Admitting: Emergency Medicine

## 2022-01-20 DIAGNOSIS — J449 Chronic obstructive pulmonary disease, unspecified: Secondary | ICD-10-CM | POA: Diagnosis not present

## 2022-01-20 DIAGNOSIS — I10 Essential (primary) hypertension: Secondary | ICD-10-CM | POA: Insufficient documentation

## 2022-01-20 DIAGNOSIS — R0789 Other chest pain: Secondary | ICD-10-CM | POA: Diagnosis not present

## 2022-01-20 DIAGNOSIS — R0602 Shortness of breath: Secondary | ICD-10-CM | POA: Diagnosis not present

## 2022-01-20 DIAGNOSIS — R0609 Other forms of dyspnea: Secondary | ICD-10-CM | POA: Insufficient documentation

## 2022-01-20 LAB — CBC
HCT: 38.3 % — ABNORMAL LOW (ref 39.0–52.0)
Hemoglobin: 13.1 g/dL (ref 13.0–17.0)
MCH: 33.1 pg (ref 26.0–34.0)
MCHC: 34.2 g/dL (ref 30.0–36.0)
MCV: 96.7 fL (ref 80.0–100.0)
Platelets: 251 10*3/uL (ref 150–400)
RBC: 3.96 MIL/uL — ABNORMAL LOW (ref 4.22–5.81)
RDW: 12.9 % (ref 11.5–15.5)
WBC: 8.7 10*3/uL (ref 4.0–10.5)
nRBC: 0 % (ref 0.0–0.2)

## 2022-01-20 LAB — BASIC METABOLIC PANEL
Anion gap: 10 (ref 5–15)
BUN: 27 mg/dL — ABNORMAL HIGH (ref 8–23)
CO2: 24 mmol/L (ref 22–32)
Calcium: 9.4 mg/dL (ref 8.9–10.3)
Chloride: 107 mmol/L (ref 98–111)
Creatinine, Ser: 1.47 mg/dL — ABNORMAL HIGH (ref 0.61–1.24)
GFR, Estimated: 49 mL/min — ABNORMAL LOW (ref >60)
Glucose, Bld: 114 mg/dL — ABNORMAL HIGH (ref 70–99)
Potassium: 4 mmol/L (ref 3.5–5.1)
Sodium: 141 mmol/L (ref 135–145)

## 2022-01-20 LAB — TROPONIN I (HIGH SENSITIVITY)
Troponin I (High Sensitivity): 8 ng/L (ref <18)
Troponin I (High Sensitivity): 6 ng/L (ref <18)

## 2022-01-20 NOTE — ED Notes (Signed)
Repeat EKG done per MD request

## 2022-01-20 NOTE — ED Triage Notes (Signed)
C/O SOB.  Symptoms have been ongoing for a while, but are worsening.  States intermittent CP with some of the events, denies current CP.  States SOB worse with exertion.  AAOx3.  Skin warm and dry. NAD

## 2022-01-20 NOTE — ED Notes (Signed)
Lab called to add on troponin 

## 2022-01-20 NOTE — Discharge Instructions (Signed)
Your blood work EKG and chest x-ray are all reassuring today.  Please follow-up with cardiology.  I placed a referral.  If you do not hear from them within the next 2 days please call the number for Dr. Glennis Brink office.  If you have new or worsening chest pain or pain that occurs at rest please return to the emergency department.

## 2022-01-20 NOTE — ED Provider Notes (Signed)
[facility] Provider Note    Event Date/Time   First MD Initiated Contact with Patient 01/20/22 1022     (approximate)   History   Shortness of Breath   HPI  Isaac Watkins is a 81 y.o. male past medical history of hypertension hyperlipidemia emphysema who presents with shortness of breath.  Patient notes his symptoms are going on for several months.  He feels short of breath with exertion.  Has been worsening and he says over the last 30 to 60 days.  Feels short of breath just walking to the mailbox or walking across the room.  Accompanied by the shortness of breath he does have some exertional chest tightness.  Says is not pain just tightness in the center of his chest that does not radiate.  No nausea or diaphoresis.  Occasionally he will feel lightheaded.  Patient does have occasional cough, no fevers or chills.  Denies PND orthopnea.  At rest he feels fine.     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/07/2020   Constipation 02/07/2020   Pure hypercholesterolemia 07/27/2019   Pulmonary emphysema (HCC) 07/27/2019   Aortic atherosclerosis (HCC) 07/27/2019   Bursitis of left shoulder 06/30/2019   Nontraumatic incomplete tear of left rotator cuff 06/30/2019   Obesity 01/26/2019   Hypertension 09/20/2018   Adjustment disorder 09/20/2018   BPH associated with nocturia 09/20/2018   Seasonal allergic rhinitis due to pollen 09/20/2018     Physical Exam  Triage Vital Signs: ED Triage Vitals  Enc Vitals Group     BP 01/20/22 1020 123/72     Pulse Rate 01/20/22 1017 72     Resp 01/20/22 1018 13     Temp --      Temp src --      SpO2 01/20/22 1017 94 %      Weight 01/20/22 0832 234 lb 9.1 oz (106.4 kg)     Height 01/20/22 0832 6' (1.829 m)     Head Circumference --      Peak Flow --      Pain Score 01/20/22 0832 0     Pain Loc --      Pain Edu? --      Excl. in GC? --     Most recent vital signs: Vitals:   01/20/22 1400 01/20/22 1430  BP: 129/65   Pulse: 67   Resp: 18   Temp:  98 F (36.7 C)  SpO2: 96%      General: Awake, no distress.  CV:  Good peripheral perfusion.  No peripheral edema Resp:  Normal effort.  Lungs are clear  abd:  No distention.  Neuro:             Awake, Alert, Oriented x 3  Other:     ED Results / Procedures / Treatments  Labs (all labs ordered are listed, but only abnormal results are displayed) Labs Reviewed  BASIC METABOLIC PANEL - Abnormal; Notable for the following components:      Result Value   Glucose, Bld 114 (*)    BUN 27 (*)    Creatinine, Ser 1.47 (*)    GFR, Estimated 49 (*)  All other components within normal limits  CBC - Abnormal; Notable for the following components:   RBC 3.96 (*)    HCT 38.3 (*)    All other components within normal limits  TROPONIN I (HIGH SENSITIVITY)  TROPONIN I (HIGH SENSITIVITY)     EKG  EKG shows sinus rhythm low voltage normal axis ST depression V3 through V6 similar to prior   RADIOLOGY I reviewed and interpreted the CXR which does not show any acute cardiopulmonary process    PROCEDURES:  Critical Care performed: No  Procedures  The patient is on the cardiac monitor to evaluate for evidence of arrhythmia and/or significant heart rate changes.   MEDICATIONS ORDERED IN ED: Medications - No data to display   IMPRESSION / MDM / ASSESSMENT AND PLAN / ED COURSE  I reviewed the triage vital signs and the nursing notes.                              Patient's presentation is most consistent with acute presentation with potential threat to life or bodily function.  Differential diagnosis includes, but is not limited to, angina,  CHF, COPD, pulmonary hypertension The patient is a 81 year old male presents with several months of exertional dyspnea.  This is getting worse over the last 30 to 60 days.  He has seen his primary doctor with cardiology appointment for December 19 but likely need to be evaluated sooner.  There is really no change today.  He has exertional chest tightness as well he says is not really pain just feels tight does not radiate no nausea or vomiting or diaphoresis.  Currently he is pain-free.  He has no PND orthopnea no lower extremity swelling.  Patient's that is 94%.  Overall he looks well does not appear volume overloaded lungs are clear.  First EKG with significant artifact we will repeat.  Chest x-ray does not have any obvious abnormalities.  CBC and BMP are reassuring.  We will add on troponin.  My primary concern would be unstable angina and  Troponins x2 are negative.  Patient ambulated to the bathroom is asymptomatic.  He is remained with normal pulse ox since being in the ED.  Given reassuring work-up and the fact he is not currently having symptoms I think he is appropriate for outpatient follow-up but will want him to follow-up closely with cardiology for additional testing.  I will place ambulatory referral.  We discussed return precautions for pain as     FINAL CLINICAL IMPRESSION(S) / ED DIAGNOSES   Final diagnoses:  DOE (dyspnea on exertion)  Dyspnea on exertion     Rx / DC Orders   ED Discharge Orders          Ordered    Ambulatory referral to Cardiology       Comments: If you have not heard from the Cardiology office within the next 72 hours please call [phone].   01/20/22 1402             Note:  This document was prepared using Dragon voice recognition software and may include unintentional dictation errors.   Georga Hacking, MD 01/20/22 [phone]

## 2022-02-06 DIAGNOSIS — I10 Essential (primary) hypertension: Secondary | ICD-10-CM | POA: Diagnosis not present

## 2022-02-06 DIAGNOSIS — I209 Angina pectoris, unspecified: Secondary | ICD-10-CM | POA: Diagnosis not present

## 2022-02-06 DIAGNOSIS — R0602 Shortness of breath: Secondary | ICD-10-CM | POA: Diagnosis not present

## 2022-02-06 DIAGNOSIS — J42 Unspecified chronic bronchitis: Secondary | ICD-10-CM | POA: Diagnosis not present

## 2022-02-14 NOTE — Progress Notes (Signed)
I,Tiffany J Bragg,acting as a scribe for Shirlee Latch, MD.,have documented all relevant documentation on the behalf of Shirlee Latch, MD,as directed by  Shirlee Latch, MD while in the presence of Shirlee Latch, MD.    Established patient visit   Patient: Isaac Watkins   DOB: 03-27-1940   81 y.o. Male  MRN: [id number] Visit Date: 02/17/2022  Today's healthcare provider: Shirlee Latch, MD   Chief Complaint  Patient presents with   Hypertension   Benign Prostatic Hypertrophy   Subjective    HPI  Follow up for hypertension  The patient was last seen for this 6 months ago. Changes made at last visit include decrease hctz to 12.5 mg daily.  He reports excellent compliance with treatment. He feels that condition is Improved. He is not having side effects.   ----------------------------------------------------------------------------------------- Follow up for BPH  The patient was last seen for this 1 months ago. Changes made at last visit include start flomax 0.4 mg daily can titrate to 0.8 mg if needed.  He reports excellent compliance with treatment. He feels that condition is Unchanged. He is not having side effects.   ----------------------------------------------------------------------------------------- Follow up for DOE  The patient was last seen for this 1 months ago. Changes made at last visit include referral to cardio. Resume Trelegy. No changes to SOB with Trelegy. Patient went to ED on 01/20/22. No changes to medication at ED. Referred to cardio again. Sees them Thursday for stress test and ultrasound.  He reports excellent compliance with treatment. He feels that condition is Unchanged. He is not having side effects.   -----------------------------------------------------------------------------------------   -----------------------------------------------------------------------------------------   Medications: Outpatient  Medications Prior to Visit  Medication Sig   albuterol (VENTOLIN HFA) 108 (90 Base) MCG/ACT inhaler Inhale 2 puffs into the lungs every 6 (six) hours as needed for wheezing or shortness of breath.   atorvastatin (LIPITOR) 40 MG tablet TAKE 1 TABLET(40 MG) BY MOUTH DAILY AT 6 PM   citalopram (CELEXA) 20 MG tablet TAKE 1 TABLET(20 MG) BY MOUTH DAILY   Docusate Sodium (DSS) 100 MG CAPS docusate sodium 100 mg capsule  TAKE 1 CAPSULE BY MOUTH TWICE DAILY   Fluticasone-Umeclidin-Vilant (TRELEGY ELLIPTA) 100-62.5-25 MCG/ACT AEPB Inhale 1 puff into the lungs daily.   hydrochlorothiazide (HYDRODIURIL) 12.5 MG tablet Take 1 tablet (12.5 mg total) by mouth daily.   losartan (COZAAR) 100 MG tablet Take 1 tablet (100 mg total) by mouth daily.   TIADYLT ER 300 MG 24 hr capsule TAKE 1 CAPSULE(300 MG) BY MOUTH DAILY   [DISCONTINUED] tamsulosin (FLOMAX) 0.4 MG CAPS capsule Take 1 capsule (0.4 mg total) by mouth daily.   No facility-administered medications prior to visit.    Review of Systems per HPI     Objective    BP 124/68 (BP Location: Left Arm, Patient Position: Sitting, Cuff Size: Normal)   Pulse 74   Resp 16   Ht 6' (1.829 m)   Wt 239 lb (108.4 kg)   SpO2 99%   BMI 32.41 kg/m    Physical Exam Vitals reviewed.  Constitutional:      General: He is not in acute distress.    Appearance: Normal appearance. He is not diaphoretic.  HENT:     Head: Normocephalic and atraumatic.  Eyes:     General: No scleral icterus.    Conjunctiva/sclera: Conjunctivae normal.  Cardiovascular:     Rate and Rhythm: Normal rate and regular rhythm.     Heart sounds: Normal heart sounds.  Pulmonary:  Effort: Pulmonary effort is normal. No respiratory distress.     Breath sounds: Normal breath sounds. No wheezing or rhonchi.  Musculoskeletal:     Cervical back: Neck supple.     Right lower leg: No edema.     Left lower leg: No edema.  Lymphadenopathy:     Cervical: No cervical adenopathy.  Skin:     General: Skin is warm and dry.     Findings: No rash.  Neurological:     Mental Status: He is alert and oriented to person, place, and time. Mental status is at baseline.  Psychiatric:        Mood and Affect: Mood normal.        Behavior: Behavior normal.       No results found for any visits on 02/17/22.  Assessment & Plan     Problem List Items Addressed This Visit       Cardiovascular and Mediastinum   Hypertension - Primary    Well controlled Continue current medications Recheck metabolic panel F/u in 6 months       Relevant Orders   Basic Metabolic Panel (BMET)     Other   BPH associated with nocturia    Some improvement with flomax, but still having nocturia x3-6 nightly Increase flomax to 0.8 mg daily      Obesity    Discussed importance of healthy weight management Discussed diet and exercise       Pure hypercholesterolemia    Previously well controlled Continue statin Repeat FLP and CMP annually        Return in about 6 months (around 08/19/2022) for AWV, CPE.      I, Shirlee Latch, MD, have reviewed all documentation for this visit. The documentation on 02/17/22 for the exam, diagnosis, procedures, and orders are all accurate and complete.   , Marzella Schlein, MD, MPH [facility]

## 2022-02-17 ENCOUNTER — Encounter: Payer: Self-pay | Admitting: Family Medicine

## 2022-02-17 ENCOUNTER — Ambulatory Visit (INDEPENDENT_AMBULATORY_CARE_PROVIDER_SITE_OTHER): Payer: Medicare Other | Admitting: Family Medicine

## 2022-02-17 VITALS — BP 124/68 | HR 74 | Resp 16 | Ht 72.0 in | Wt 239.0 lb

## 2022-02-17 DIAGNOSIS — I10 Essential (primary) hypertension: Secondary | ICD-10-CM | POA: Diagnosis not present

## 2022-02-17 DIAGNOSIS — N401 Enlarged prostate with lower urinary tract symptoms: Secondary | ICD-10-CM

## 2022-02-17 DIAGNOSIS — E7800 Pure hypercholesterolemia, unspecified: Secondary | ICD-10-CM | POA: Diagnosis not present

## 2022-02-17 DIAGNOSIS — R351 Nocturia: Secondary | ICD-10-CM

## 2022-02-17 DIAGNOSIS — E669 Obesity, unspecified: Secondary | ICD-10-CM

## 2022-02-17 DIAGNOSIS — Z6832 Body mass index (BMI) 32.0-32.9, adult: Secondary | ICD-10-CM

## 2022-02-17 MED ORDER — TAMSULOSIN HCL 0.4 MG PO CAPS: 0.8000 mg | ORAL_CAPSULE | Freq: Every day | ORAL | 1 refills | Status: DC

## 2022-02-17 NOTE — Assessment & Plan Note (Signed)
Discussed importance of healthy weight management Discussed diet and exercise  

## 2022-02-17 NOTE — Assessment & Plan Note (Signed)
Some improvement with flomax, but still having nocturia x3-6 nightly Increase flomax to 0.8 mg daily

## 2022-02-17 NOTE — Assessment & Plan Note (Signed)
Previously well controlled Continue statin Repeat FLP and CMP annually 

## 2022-02-17 NOTE — Assessment & Plan Note (Signed)
Well controlled Continue current medications Recheck metabolic panel F/u in 6 months  

## 2022-02-18 ENCOUNTER — Other Ambulatory Visit: Payer: Self-pay

## 2022-02-18 DIAGNOSIS — I10 Essential (primary) hypertension: Secondary | ICD-10-CM

## 2022-02-18 DIAGNOSIS — N289 Disorder of kidney and ureter, unspecified: Secondary | ICD-10-CM

## 2022-02-18 LAB — BASIC METABOLIC PANEL
BUN/Creatinine Ratio: 13 (ref 10–24)
BUN: 25 mg/dL (ref 8–27)
CO2: 22 mmol/L (ref 20–29)
Calcium: 9.2 mg/dL (ref 8.6–10.2)
Chloride: 102 mmol/L (ref 96–106)
Creatinine, Ser: 1.88 mg/dL — ABNORMAL HIGH (ref 0.76–1.27)
Glucose: 104 mg/dL — ABNORMAL HIGH (ref 70–99)
Potassium: 4.2 mmol/L (ref 3.5–5.2)
Sodium: 138 mmol/L (ref 134–144)
eGFR: 36 mL/min/{1.73_m2} — ABNORMAL LOW (ref >59)

## 2022-02-18 MED ORDER — AMLODIPINE BESYLATE 5 MG PO TABS: 5.0000 mg | ORAL_TABLET | Freq: Every day | ORAL | 0 refills | Status: DC

## 2022-02-19 ENCOUNTER — Other Ambulatory Visit: Payer: Self-pay | Admitting: Family Medicine

## 2022-02-19 NOTE — Telephone Encounter (Signed)
Requested Prescriptions  Pending Prescriptions Disp Refills   hydrochlorothiazide (HYDRODIURIL) 12.5 MG tablet [Pharmacy Med Name: HYDROCHLOROTHIAZIDE 12.5MG  TABLETS] 90 tablet 1    Sig: TAKE 1 TABLET(12.5 MG) BY MOUTH DAILY     Cardiovascular: Diuretics - Thiazide Failed - 02/19/2022 10:16 AM      Failed - Cr in normal range and within 180 days    Creatinine, Ser  Date Value Ref Range Status  02/17/2022 1.88 (H) 0.76 - 1.27 mg/dL Final         Passed - K in normal range and within 180 days    Potassium  Date Value Ref Range Status  02/17/2022 4.2 3.5 - 5.2 mmol/L Final         Passed - Na in normal range and within 180 days    Sodium  Date Value Ref Range Status  02/17/2022 138 134 - 144 mmol/L Final         Passed - Last BP in normal range    BP Readings from Last 1 Encounters:  02/17/22 124/68         Passed - Valid encounter within last 6 months    Recent Outpatient Visits           2 days ago Primary hypertension   [facility], Marzella Schlein, MD   1 month ago BPH associated with nocturia   [facility], Marzella Schlein, MD   2 months ago COVID-19   [facility], [place], PA-C   6 months ago Encounter for annual wellness visit (AWV) in Medicare patient   [facility], Marzella Schlein, MD   6 months ago Chronic obstructive pulmonary disease with acute exacerbation [facility])   [facility] Bacigalupo, Marzella Schlein, MD       Future Appointments             In 5 months Bacigalupo, Marzella Schlein, MD [facility], [facility]

## 2022-02-20 DIAGNOSIS — R0602 Shortness of breath: Secondary | ICD-10-CM | POA: Diagnosis not present

## 2022-02-21 ENCOUNTER — Encounter: Payer: Self-pay | Admitting: Family Medicine

## 2022-02-25 ENCOUNTER — Other Ambulatory Visit: Payer: Self-pay | Admitting: Family Medicine

## 2022-02-25 ENCOUNTER — Ambulatory Visit
Admission: RE | Admit: 2022-02-25 | Discharge: 2022-02-25 | Disposition: A | Discharge status: Home / Self Care | Payer: Medicare Other | Source: Ambulatory Visit | Attending: Family Medicine | Admitting: Family Medicine

## 2022-02-25 DIAGNOSIS — R933 Abnormal findings on diagnostic imaging of other parts of digestive tract: Secondary | ICD-10-CM | POA: Diagnosis not present

## 2022-02-25 DIAGNOSIS — K862 Cyst of pancreas: Secondary | ICD-10-CM | POA: Insufficient documentation

## 2022-02-25 DIAGNOSIS — R935 Abnormal findings on diagnostic imaging of other abdominal regions, including retroperitoneum: Secondary | ICD-10-CM | POA: Diagnosis not present

## 2022-02-25 DIAGNOSIS — I714 Abdominal aortic aneurysm, without rupture, unspecified: Secondary | ICD-10-CM | POA: Diagnosis not present

## 2022-02-25 MED ORDER — GADOBUTROL 1 MMOL/ML IV SOLN
10.0000 mL | Freq: Once | INTRAVENOUS | Status: AC | PRN
  Administered 2022-02-25: 10 mL via INTRAVENOUS

## 2022-02-28 ENCOUNTER — Telehealth: Payer: Self-pay

## 2022-02-28 DIAGNOSIS — K862 Cyst of pancreas: Secondary | ICD-10-CM

## 2022-02-28 NOTE — Addendum Note (Signed)
Addended by: Erasmo Downer on: 02/28/2022 11:30 AM   Modules accepted: Orders

## 2022-02-28 NOTE — Telephone Encounter (Signed)
-----   Message from Erasmo Downer, MD sent at 02/28/2022 10:38 AM EST ----- I ran his imaging by one of our surgeons who recommended that he see Dr Sophronia Simas (a GI Surgeon) in [place] vs [facility]. He felt it was an intermediate lesion.  They would be able to discuss biopsy vs monitoring with him and do an endoscopic ultrasound with biopsy if that is the route they decided to go.  Ok to place referral for whichever he chooses. ----- Message ----- From: Myles Lipps, CMA Sent: 02/28/2022   9:16 AM EST To: Erasmo Downer, MD  Patient agreed to referral. Please advise. 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  There are no suspicious features on MRI of these.  The options recommended are either to repeat imaging in 6 months to see if they have grown further or to consider biopsy.  Please let me know which you would rather and I will find out who can do a biopsy of a pancreatic cyst in the area.

## 2022-03-05 DIAGNOSIS — K8689 Other specified diseases of pancreas: Secondary | ICD-10-CM | POA: Diagnosis not present

## 2022-03-05 DIAGNOSIS — J449 Chronic obstructive pulmonary disease, unspecified: Secondary | ICD-10-CM | POA: Diagnosis not present

## 2022-03-05 DIAGNOSIS — I10 Essential (primary) hypertension: Secondary | ICD-10-CM | POA: Diagnosis not present

## 2022-03-05 DIAGNOSIS — R0602 Shortness of breath: Secondary | ICD-10-CM | POA: Diagnosis not present

## 2022-03-10 ENCOUNTER — Other Ambulatory Visit: Payer: Self-pay | Admitting: Family Medicine

## 2022-03-14 DIAGNOSIS — D490 Neoplasm of unspecified behavior of digestive system: Secondary | ICD-10-CM | POA: Diagnosis not present

## 2022-03-17 ENCOUNTER — Encounter: Payer: Self-pay | Admitting: Family Medicine

## 2022-03-17 ENCOUNTER — Ambulatory Visit (INDEPENDENT_AMBULATORY_CARE_PROVIDER_SITE_OTHER): Payer: Medicare Other | Admitting: Family Medicine

## 2022-03-17 VITALS — BP 121/60 | HR 64 | Temp 97.6°F | Resp 16 | Wt 248.8 lb

## 2022-03-17 DIAGNOSIS — J0140 Acute pansinusitis, unspecified: Secondary | ICD-10-CM | POA: Diagnosis not present

## 2022-03-17 DIAGNOSIS — R7989 Other specified abnormal findings of blood chemistry: Secondary | ICD-10-CM

## 2022-03-17 DIAGNOSIS — I10 Essential (primary) hypertension: Secondary | ICD-10-CM

## 2022-03-17 DIAGNOSIS — IMO0002 Reserved for concepts with insufficient information to code with codable children: Secondary | ICD-10-CM | POA: Insufficient documentation

## 2022-03-17 MED ORDER — AMOXICILLIN-POT CLAVULANATE 875-125 MG PO TABS: 1.0000 | ORAL_TABLET | Freq: Two times a day (BID) | ORAL | 0 refills | Status: AC

## 2022-03-17 NOTE — Assessment & Plan Note (Signed)
New problem Unclear etiology Worsening on last 2 sets of labs Cr baseline 1.1-1.2, most recently 1.88 Continue to hold HCTZ Encourage hydration Avoid NSAIDs Repeat today If not improving, consider renal US and additional testing Consider renal consult

## 2022-03-17 NOTE — Progress Notes (Signed)
I,Sulibeya S Dimas,acting as a Education administrator for Lavon Paganini, MD.,have documented all relevant documentation on the behalf of Lavon Paganini, MD,as directed by  Lavon Paganini, MD while in the presence of Lavon Paganini, MD.     Established patient visit   Patient: Jenny Omdahl   DOB: 1940-07-05   82 y.o. Male  MRN: [id number] Visit Date: 03/17/2022  Today's healthcare provider: Lavon Paganini, MD   Chief Complaint  Patient presents with   Hypertension   Subjective    HPI  Hypertension, follow-up  BP Readings from Last 3 Encounters:  03/17/22 121/60  02/17/22 124/68  01/20/22 129/65   Wt Readings from Last 3 Encounters:  03/17/22 248 lb 12.8 oz (112.9 kg)  02/17/22 239 lb (108.4 kg)  01/20/22 234 lb 9.1 oz (106.4 kg)     He was last seen for hypertension 4 weeks ago.  BP at that visit was 124/68. Management since that visit includes hold HCTZ. Replace with amlodipine 5 mg daily continue losartan for now. Repeat BMP in 2 weeks.   He reports excellent compliance with treatment. He is not having side effects.  Outside blood pressures are not being checked.  Pertinent labs Lab Results  Component Value Date   CHOL 162 08/13/2021   HDL 49 08/13/2021   LDLCALC 93 08/13/2021   TRIG 110 08/13/2021   CHOLHDL 2.6 01/27/2019   Lab Results  Component Value Date   NA 138 02/17/2022   K 4.2 02/17/2022   CREATININE 1.88 (H) 02/17/2022   EGFR 36 (L) 02/17/2022   GLUCOSE 104 (H) 02/17/2022   TSH 2.97 03/05/2018     The 10-year ASCVD risk score (Arnett DK, et al., 2019) is: 31.5%  --------------------------------------------------------------------------------------------------- Holding HCTZ after continued increasing Cr Replaced with Amlodipine 5mg  Still on losartan, advised hydration  Sinus pain and pressure x many weeks. Tried afrin x3 days   Medications: Outpatient Medications Prior to Visit  Medication Sig   albuterol (VENTOLIN HFA) 108 (90  Base) MCG/ACT inhaler Inhale 2 puffs into the lungs every 6 (six) hours as needed for wheezing or shortness of breath.   amLODipine (NORVASC) 5 MG tablet Take 1 tablet (5 mg total) by mouth daily.   atorvastatin (LIPITOR) 40 MG tablet TAKE 1 TABLET(40 MG) BY MOUTH DAILY AT 6 PM   citalopram (CELEXA) 20 MG tablet TAKE 1 TABLET(20 MG) BY MOUTH DAILY   Fluticasone-Umeclidin-Vilant (TRELEGY ELLIPTA) 100-62.5-25 MCG/ACT AEPB Inhale 1 puff into the lungs daily.   losartan (COZAAR) 100 MG tablet Take 1 tablet (100 mg total) by mouth daily.   tamsulosin (FLOMAX) 0.4 MG CAPS capsule Take 2 capsules (0.8 mg total) by mouth daily.   TIADYLT ER 300 MG 24 hr capsule TAKE 1 CAPSULE(300 MG) BY MOUTH DAILY   [DISCONTINUED] Docusate Sodium (DSS) 100 MG CAPS docusate sodium 100 mg capsule  TAKE 1 CAPSULE BY MOUTH TWICE DAILY   [DISCONTINUED] hydrochlorothiazide (HYDRODIURIL) 12.5 MG tablet TAKE 1 TABLET(12.5 MG) BY MOUTH DAILY (Patient not taking: Reported on 03/17/2022)   No facility-administered medications prior to visit.    Review of Systems  Constitutional:  Negative for appetite change, chills, fatigue, fever and unexpected weight change.  Eyes:  Negative for visual disturbance.  Respiratory:  Positive for shortness of breath. Negative for chest tightness.   Cardiovascular:  Negative for chest pain, palpitations and leg swelling.  Gastrointestinal:  Negative for abdominal pain, nausea and vomiting.  Neurological:  Negative for dizziness, light-headedness and headaches.  Objective    BP 121/60 (BP Location: Right Arm, Patient Position: Sitting, Cuff Size: Large)   Pulse 64   Temp 97.6 F (36.4 C) (Oral)   Resp 16   Wt 248 lb 12.8 oz (112.9 kg)   SpO2 97%   BMI 33.74 kg/m  BP Readings from Last 3 Encounters:  03/17/22 121/60  02/17/22 124/68  01/20/22 129/65   Wt Readings from Last 3 Encounters:  03/17/22 248 lb 12.8 oz (112.9 kg)  02/17/22 239 lb (108.4 kg)  01/20/22 234 lb 9.1 oz  (106.4 kg)     Physical Exam Vitals reviewed.  Constitutional:      General: He is not in acute distress.    Appearance: Normal appearance. He is not diaphoretic.  HENT:     Head: Normocephalic and atraumatic.     Nose: Congestion present.  Eyes:     General: No scleral icterus.    Conjunctiva/sclera: Conjunctivae normal.  Cardiovascular:     Rate and Rhythm: Normal rate and regular rhythm.     Pulses: Normal pulses.     Heart sounds: Normal heart sounds. No murmur heard. Pulmonary:     Effort: Pulmonary effort is normal. No respiratory distress.     Breath sounds: Normal breath sounds. No wheezing or rhonchi.  Musculoskeletal:     Cervical back: Neck supple.     Right lower leg: No edema.     Left lower leg: No edema.  Lymphadenopathy:     Cervical: No cervical adenopathy.  Skin:    General: Skin is warm and dry.     Findings: No rash.  Neurological:     Mental Status: He is alert and oriented to person, place, and time. Mental status is at baseline.  Psychiatric:        Mood and Affect: Mood normal.        Behavior: Behavior normal.       No results found for any visits on 03/17/22.  Assessment & Plan     Problem List Items Addressed This Visit       Cardiovascular and Mediastinum   Hypertension - Primary    Well controlled Continue current medications Recheck metabolic panel F/u in 6 months       Relevant Orders   Renal Function Panel     Other   Creatinine elevation    New problem Unclear etiology Worsening on last 2 sets of labs Cr baseline 1.1-1.2, most recently 1.88 Continue to hold HCTZ Encourage hydration Avoid NSAIDs Repeat today If not improving, consider renal US and additional testing Consider renal consult      Relevant Orders   Renal Function Panel   Other Visit Diagnoses     Acute non-recurrent pansinusitis       Relevant Medications   amoxicillin-clavulanate (AUGMENTIN) 875-125 MG tablet      - symptoms and exam c/w  sinusitis   - no evidence of AOM, CAP, strep pharyngitis, or other infection - given duration of symptoms, suspect bacterial etiology - will treat with augmentin x7d - discussed symptomatic management (flonase, decongestants, etc), natural course, and return precautions    Return in about 5 months (around 08/16/2022) for as scheduled.      I, Shirlee Latch, MD, have reviewed all documentation for this visit. The documentation on 03/17/22 for the exam, diagnosis, procedures, and orders are all accurate and complete.   Yosselin Zoeller, Marzella Schlein, MD, MPH [facility]

## 2022-03-17 NOTE — Assessment & Plan Note (Signed)
Well controlled Continue current medications Recheck metabolic panel F/u in 6 months  

## 2022-03-18 LAB — RENAL FUNCTION PANEL
Albumin: 4.2 g/dL (ref 3.8–4.8)
BUN/Creatinine Ratio: 15 (ref 10–24)
BUN: 19 mg/dL (ref 8–27)
CO2: 22 mmol/L (ref 20–29)
Calcium: 9.3 mg/dL (ref 8.6–10.2)
Chloride: 104 mmol/L (ref 96–106)
Creatinine, Ser: 1.29 mg/dL — ABNORMAL HIGH (ref 0.76–1.27)
Glucose: 99 mg/dL (ref 70–99)
Phosphorus: 2.9 mg/dL (ref 2.8–4.1)
Potassium: 4 mmol/L (ref 3.5–5.2)
Sodium: 140 mmol/L (ref 134–144)
eGFR: 56 mL/min/{1.73_m2} — ABNORMAL LOW (ref >59)

## 2022-03-23 ENCOUNTER — Other Ambulatory Visit: Payer: Self-pay | Admitting: Family Medicine

## 2022-03-25 DIAGNOSIS — J449 Chronic obstructive pulmonary disease, unspecified: Secondary | ICD-10-CM | POA: Diagnosis not present

## 2022-03-25 DIAGNOSIS — R0602 Shortness of breath: Secondary | ICD-10-CM | POA: Diagnosis not present

## 2022-03-26 DIAGNOSIS — D490 Neoplasm of unspecified behavior of digestive system: Secondary | ICD-10-CM | POA: Diagnosis not present

## 2022-03-26 DIAGNOSIS — K862 Cyst of pancreas: Secondary | ICD-10-CM | POA: Diagnosis not present

## 2022-03-26 DIAGNOSIS — K8689 Other specified diseases of pancreas: Secondary | ICD-10-CM | POA: Diagnosis not present

## 2022-03-31 ENCOUNTER — Other Ambulatory Visit: Payer: Self-pay | Admitting: Family Medicine

## 2022-04-02 DIAGNOSIS — D490 Neoplasm of unspecified behavior of digestive system: Secondary | ICD-10-CM | POA: Diagnosis not present

## 2022-04-14 ENCOUNTER — Telehealth: Payer: Self-pay | Admitting: Family Medicine

## 2022-04-14 NOTE — Telephone Encounter (Signed)
[facility] requesting refill  losartan (COZAAR) 100 MG tablet  Please advise

## 2022-04-14 NOTE — Telephone Encounter (Signed)
[facility] pharmacy requesting refill losartan (COZAAR) 100 MG tablet  Please advise

## 2022-04-15 MED ORDER — LOSARTAN POTASSIUM 100 MG PO TABS: 100.0000 mg | ORAL_TABLET | Freq: Every day | ORAL | 0 refills | Status: DC

## 2022-05-08 DIAGNOSIS — B079 Viral wart, unspecified: Secondary | ICD-10-CM | POA: Diagnosis not present

## 2022-05-26 ENCOUNTER — Other Ambulatory Visit: Payer: Self-pay | Admitting: Family Medicine

## 2022-05-26 DIAGNOSIS — N289 Disorder of kidney and ureter, unspecified: Secondary | ICD-10-CM

## 2022-05-26 DIAGNOSIS — I10 Essential (primary) hypertension: Secondary | ICD-10-CM

## 2022-06-10 ENCOUNTER — Telehealth: Payer: Self-pay | Admitting: *Deleted

## 2022-06-10 NOTE — Telephone Encounter (Signed)
Spoke with pt and advised that per CMS guidelines, he will no longer qualify for lung cancer screening due to current age of 88. CT appt has been cancelled. Pt verbalized understanding and will f/I with his PCP regarding any further f/u needed.

## 2022-06-19 ENCOUNTER — Ambulatory Visit: Payer: Self-pay

## 2022-06-19 DIAGNOSIS — J449 Chronic obstructive pulmonary disease, unspecified: Secondary | ICD-10-CM | POA: Diagnosis not present

## 2022-07-09 DIAGNOSIS — K080 Exfoliation of teeth due to systemic causes: Secondary | ICD-10-CM | POA: Diagnosis not present

## 2022-07-28 ENCOUNTER — Telehealth: Payer: Self-pay | Admitting: Family Medicine

## 2022-07-28 MED ORDER — LOSARTAN POTASSIUM 100 MG PO TABS: 100.0000 mg | ORAL_TABLET | Freq: Every day | ORAL | 1 refills | Status: DC

## 2022-07-28 NOTE — Telephone Encounter (Signed)
[facility] requesting prescription refill losartan (COZAAR) 100 MG tablet  Please advise

## 2022-07-29 ENCOUNTER — Telehealth: Payer: Self-pay | Admitting: Family Medicine

## 2022-08-15 ENCOUNTER — Encounter: Payer: Medicare Other | Admitting: Family Medicine

## 2022-08-21 ENCOUNTER — Ambulatory Visit: Payer: Medicare Other | Admitting: Dermatology

## 2022-08-21 ENCOUNTER — Encounter: Payer: Self-pay | Admitting: Dermatology

## 2022-08-21 VITALS — BP 101/63

## 2022-08-21 DIAGNOSIS — Z79899 Other long term (current) drug therapy: Secondary | ICD-10-CM

## 2022-08-21 DIAGNOSIS — X32XXXA Exposure to sunlight, initial encounter: Secondary | ICD-10-CM

## 2022-08-21 DIAGNOSIS — W908XXA Exposure to other nonionizing radiation, initial encounter: Secondary | ICD-10-CM | POA: Diagnosis not present

## 2022-08-21 DIAGNOSIS — L711 Rhinophyma: Secondary | ICD-10-CM

## 2022-08-21 DIAGNOSIS — L718 Other rosacea: Secondary | ICD-10-CM | POA: Diagnosis not present

## 2022-08-21 DIAGNOSIS — L570 Actinic keratosis: Secondary | ICD-10-CM | POA: Diagnosis not present

## 2022-08-21 DIAGNOSIS — L738 Other specified follicular disorders: Secondary | ICD-10-CM

## 2022-08-21 DIAGNOSIS — B079 Viral wart, unspecified: Secondary | ICD-10-CM | POA: Diagnosis not present

## 2022-08-21 DIAGNOSIS — Z7189 Other specified counseling: Secondary | ICD-10-CM

## 2022-08-21 DIAGNOSIS — L219 Seborrheic dermatitis, unspecified: Secondary | ICD-10-CM

## 2022-08-21 DIAGNOSIS — L578 Other skin changes due to chronic exposure to nonionizing radiation: Secondary | ICD-10-CM

## 2022-08-21 MED ORDER — FLUOROURACIL 5 % EX CREA: TOPICAL_CREAM | Freq: Every day | CUTANEOUS | 4 refills | Status: DC

## 2022-08-21 MED ORDER — KETOCONAZOLE 2 % EX CREA: 1.0000 | TOPICAL_CREAM | CUTANEOUS | 6 refills | Status: DC

## 2022-08-21 NOTE — Progress Notes (Signed)
New Patient Visit   Subjective  Isaac Watkins is a 82 y.o. male who presents for the following: wart L ring finger ~63yr, txted in past, bumps nose >1 yr, no treatment, rash forehead yrs, using otc HC The patient has spots, moles and lesions to be evaluated, some may be new or changing and the patient may have concern these could be cancer.  The following portions of the chart were reviewed this encounter and updated as appropriate: medications, allergies, medical history  Review of Systems:  No other skin or systemic complaints except as noted in HPI or Assessment and Plan.  Objective  Well appearing patient in no apparent distress; mood and affect are within normal limits. A focused examination was performed of the following areas: Left hand, face Relevant exam findings are noted in the Assessment and Plan.  L 4th finger subungual at the eponichium x 1 Verrucous papules -- Discussed viral etiology and contagion.      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 Condition is symptomatic/ bothersome to patient. Not currently at goal.  Rosacea is a chronic progressive skin condition usually affecting the face of adults, causing redness and/or acne bumps. It is treatable but not curable. It sometimes affects the eyes (ocular rosacea) as well. It may respond to topical and/or systemic medication and can flare with stress, sun exposure, alcohol, exercise, topical steroids (including hydrocortisone/cortisone 10) and some foods.  Daily application of broad spectrum spf 30+ sunscreen to face is recommended to reduce flares. Treatment Plan Discussed Isotretinoin and side effects, pt declines. Also discussed cosmetic surgery,  laser and topical and oral treatment. Patient declines all treatment at this time.  SEBORRHEIC DERMATITIS Exam: mild scale eyebrows, glabella Chronic and persistent condition with duration or expected duration over one year. Condition is symptomatic/ bothersome to patient. Not currently at goal.  Seborrheic Dermatitis is a chronic persistent rash characterized by pinkness and scaling most commonly of the mid face but also can occur on the scalp (dandruff), ears; mid chest, mid back and groin.  It tends to be exacerbated by stress and cooler weather.  People who have neurologic disease may experience new onset or exacerbation of existing seborrheic dermatitis.  The condition is not curable but treatable and can be controlled. Treatment Plan: Start Ketoconazole 2% cr 3d/wk at bedtime  to eyebrows, glabella  ACTINIC DAMAGE - chronic, secondary to cumulative UV radiation exposure/sun exposure over time - diffuse scaly erythematous macules with underlying dyspigmentation - Recommend daily broad spectrum sunscreen SPF 30+ to sun-exposed areas, reapply every 2 hours as needed.  - Recommend staying in the shade or wearing long sleeves, sun glasses (UVA+UVB protection) and wide brim hats (4-inch brim around the entire circumference of the hat). - Call for new or changing lesions.   Viral warts, unspecified type L 4th finger subungual at the eponichium x 1  Viral Wart (HPV) Counseling  Discussed viral / HPV (Human Papilloma Virus) etiology and risk of spread /infectivity to other areas of body as well as to other people.  Multiple treatments and methods may be required to clear warts and it is possible treatment may not be successful.  Treatment risks include discoloration; scarring and there is still potential for  wart recurrence.  Start SM2 Fluorouracil 5% / Salicylic Acid 70% Paste qhs to wart and cover with band aid   Destruction of lesion - L 4th finger subungual at the eponichium x  1 Complexity: simple   Destruction method: cryotherapy   Informed consent: discussed and consent obtained   Timeout:  patient name, date of birth, surgical site, and procedure verified Lesion destroyed using liquid nitrogen: Yes   Region frozen until ice ball extended beyond lesion: Yes   Outcome: patient tolerated procedure well with no complications   Post-procedure details: wound care instructions given    AK (actinic keratosis) (7) forehead x 7  Destruction of lesion - forehead x 7 Complexity: simple   Destruction method: cryotherapy   Informed consent: discussed and consent obtained   Timeout:  patient name, date of birth, surgical site, and procedure verified Lesion destroyed using liquid nitrogen: Yes   Region frozen until ice ball extended beyond lesion: Yes   Outcome: patient tolerated procedure well with no complications   Post-procedure details: wound care instructions given    Actinic skin damage  Counseling and coordination of care  Seborrheic dermatitis  Medication management  ACTINIC DAMAGE - chronic, secondary to cumulative UV radiation exposure/sun exposure over time - diffuse scaly erythematous macules with underlying dyspigmentation - Recommend daily broad spectrum sunscreen SPF 30+ to sun-exposed areas, reapply every 2 hours as needed.  - Recommend staying in the shade or wearing long sleeves, sun glasses (UVA+UVB protection) and wide brim hats (4-inch brim around the entire circumference of the hat). - Call for new or changing lesions.  Return in about 4 months (around 12/21/2022) for wart f/u, AK f/u.  I, Ardis Rowan, RMA, am acting as scribe for Armida Sans, MD .  Documentation: I have reviewed the above documentation for accuracy and completeness, and I agree with the above.  Armida Sans, MD

## 2022-08-21 NOTE — Patient Instructions (Addendum)
Instructions for [facility] Medications  One or more of your medications was sent to the [facility] mail order compounding pharmacy. You will receive an email from them and can purchase the medicine through that link. It will then be mailed to your home at the address you confirmed. If for any reason you do not receive an email from them, please check your spam folder. If you still do not find the email, please let us know. [facility] phone number is [phone].       Due to recent changes in healthcare laws, you may see results of your pathology and/or laboratory studies on MyChart before the doctors have had a chance to review them. We understand that in some cases there may be results that are confusing or concerning to you. Please understand that not all results are received at the same time and often the doctors may need to interpret multiple results in order to provide you with the best plan of care or course of treatment. Therefore, we ask that you please give us 2 business days to thoroughly review all your results before contacting the office for clarification. Should we see a critical lab result, you will be contacted sooner.   If You Need Anything After Your Visit  If you have any questions or concerns for your doctor, please call our main line at [phone] and press option 4 to reach your doctor's medical assistant. If no one answers, please leave a voicemail as directed and we will return your call as soon as possible. Messages left after 4 pm will be answered the following business day.   You may also send us a message via MyChart. We typically respond to MyChart messages within 1-2 business days.  For prescription refills, please ask your pharmacy to contact our office. Our fax number is [phone].  If you have an urgent issue when the clinic is closed that cannot wait until the next business day, you can page your doctor at the number below.    Please note  that while we do our best to be available for urgent issues outside of office hours, we are not available 24/7.   If you have an urgent issue and are unable to reach us, you may choose to seek medical care at your doctor's office, retail clinic, urgent care center, or emergency room.  If you have a medical emergency, please immediately call 911 or go to the emergency department.  Pager Numbers  - Dr. Kowalski: [phone]  - Dr. Moye: [phone]  - Dr. Stewart: [phone]  In the event of inclement weather, please call our main line at [phone] for an update on the status of any delays or closures.  Dermatology Medication Tips: Please keep the boxes that topical medications come in in order to help keep track of the instructions about where and how to use these. Pharmacies typically print the medication instructions only on the boxes and not directly on the medication tubes.   If your medication is too expensive, please contact our office at [phone] option 4 or send us a message through MyChart.   We are unable to tell what your co-pay for medications will be in advance as this is different depending on your insurance coverage. However, we may be able to find a substitute medication at lower cost or fill out paperwork to get insurance to cover a needed medication.   If a prior authorization is required to get your medication covered by your insurance   company, please allow us 1-2 business days to complete this process.  Drug prices often vary depending on where the prescription is filled and some pharmacies may offer cheaper prices.  The website www.goodrx.com contains coupons for medications through different pharmacies. The prices here do not account for what the cost may be with help from insurance (it may be cheaper with your insurance), but the website can give you the price if you did not use any insurance.  - You can print the associated coupon and take it with your  prescription to the pharmacy.  - You may also stop by our office during regular business hours and pick up a GoodRx coupon card.  - If you need your prescription sent electronically to a different pharmacy, notify our office through [facility] MyChart or by phone at [phone] option 4.     Si Usted Necesita Algo Despus de Su Visita  Tambin puede enviarnos un mensaje a travs de MyChart. Por lo general respondemos a los mensajes de MyChart en el transcurso de 1 a 2 das hbiles.  Para renovar recetas, por favor pida a su farmacia que se ponga en contacto con nuestra oficina. Nuestro nmero de fax es el [phone].  Si tiene un asunto urgente cuando la clnica est cerrada y que no puede esperar hasta el siguiente da hbil, puede llamar/localizar a su doctor(a) al nmero que aparece a continuacin.   Por favor, tenga en cuenta que aunque hacemos todo lo posible para estar disponibles para asuntos urgentes fuera del horario de oficina, no estamos disponibles las 24 horas del da, los 7 das de la semana.   Si tiene un problema urgente y no puede comunicarse con nosotros, puede optar por buscar atencin mdica  en el consultorio de su doctor(a), en una clnica privada, en un centro de atencin urgente o en una sala de emergencias.  Si tiene una emergencia mdica, por favor llame inmediatamente al 911 o vaya a la sala de emergencias.  Nmeros de bper  - Dr. Kowalski: [phone]  - Dra. Moye: [phone]  - Dra. Stewart: [phone]  En caso de inclemencias del tiempo, por favor llame a nuestra lnea principal al [phone] para una actualizacin sobre el estado de cualquier retraso o cierre.  Consejos para la medicacin en dermatologa: Por favor, guarde las cajas en las que vienen los medicamentos de uso tpico para ayudarle a seguir las instrucciones sobre dnde y cmo usarlos. Las farmacias generalmente imprimen las instrucciones del medicamento slo en las cajas y no  directamente en los tubos del medicamento.   Si su medicamento es muy caro, por favor, pngase en contacto con nuestra oficina llamando al [phone] y presione la opcin 4 o envenos un mensaje a travs de MyChart.   No podemos decirle cul ser su copago por los medicamentos por adelantado ya que esto es diferente dependiendo de la cobertura de su seguro. Sin embargo, es posible que podamos encontrar un medicamento sustituto a menor costo o llenar un formulario para que el seguro cubra el medicamento que se considera necesario.   Si se requiere una autorizacin previa para que su compaa de seguros cubra su medicamento, por favor permtanos de 1 a 2 das hbiles para completar este proceso.  Los precios de los medicamentos varan con frecuencia dependiendo del lugar de dnde se surte la receta y alguna farmacias pueden ofrecer precios ms baratos.  El sitio web www.goodrx.com tiene cupones para medicamentos de diferentes farmacias. Los precios aqu no tienen en cuenta   lo que podra costar con la ayuda del seguro (puede ser ms barato con su seguro), pero el sitio web puede darle el precio si no utiliz ningn seguro.  - Puede imprimir el cupn correspondiente y llevarlo con su receta a la farmacia.  - Tambin puede pasar por nuestra oficina durante el horario de atencin regular y recoger una tarjeta de cupones de GoodRx.  - Si necesita que su receta se enve electrnicamente a una farmacia diferente, informe a nuestra oficina a travs de MyChart de [place] o por telfono llamando al [phone] y presione la opcin 4.  

## 2022-08-23 ENCOUNTER — Encounter: Payer: Self-pay | Admitting: Dermatology

## 2022-09-09 ENCOUNTER — Ambulatory Visit (INDEPENDENT_AMBULATORY_CARE_PROVIDER_SITE_OTHER): Payer: Medicare Other | Admitting: Family Medicine

## 2022-09-09 ENCOUNTER — Encounter: Payer: Self-pay | Admitting: Family Medicine

## 2022-09-09 VITALS — BP 101/64 | HR 87 | Ht 72.0 in | Wt 241.7 lb

## 2022-09-09 DIAGNOSIS — I10 Essential (primary) hypertension: Secondary | ICD-10-CM

## 2022-09-09 DIAGNOSIS — R739 Hyperglycemia, unspecified: Secondary | ICD-10-CM

## 2022-09-09 DIAGNOSIS — E7800 Pure hypercholesterolemia, unspecified: Secondary | ICD-10-CM | POA: Diagnosis not present

## 2022-09-09 DIAGNOSIS — Z0000 Encounter for general adult medical examination without abnormal findings: Secondary | ICD-10-CM | POA: Diagnosis not present

## 2022-09-09 DIAGNOSIS — M109 Gout, unspecified: Secondary | ICD-10-CM

## 2022-09-09 DIAGNOSIS — F4323 Adjustment disorder with mixed anxiety and depressed mood: Secondary | ICD-10-CM

## 2022-09-09 MED ORDER — INDOMETHACIN 50 MG PO CAPS: 50.0000 mg | ORAL_CAPSULE | Freq: Three times a day (TID) | ORAL | 0 refills | Status: AC

## 2022-09-09 NOTE — Assessment & Plan Note (Signed)
Well controlled on Atorvastatin. -Check lipid panel today.

## 2022-09-09 NOTE — Assessment & Plan Note (Signed)
Well controlled on Amlodipine 5mg  daily and Losartan 100mg  daily.

## 2022-09-09 NOTE — Assessment & Plan Note (Signed)
Stable on Citalopram 

## 2022-09-09 NOTE — Progress Notes (Signed)
Annual Wellness Visit     Patient: Isaac Watkins, Male    DOB: 1940-08-20, 82 y.o.   MRN: [id number]  Subjective  Chief Complaint  Patient presents with   Annual Exam    Patient reports that he is consuming a general diet and participates in exercise at least 2 times a week by either using stationary bike for at least 15 mins or participating in golf for 2 hours. Patient reports he is feeling well and sleeping well. Patient reports concerns with his left wrist. Reports he woke up yesterday morning with it hurting and it did not do any better throughout the day. States that it is a little better today but still sore. Reports hx of gout over 20 years ago     Isaac Watkins is a 82 y.o. male who presents today for his Annual Wellness Visit.  HPI  Discussed the use of AI scribe software for clinical note transcription with the patient, who gave verbal consent to proceed.  History of Present Illness   The patient, with a past medical history of gout, hypertension, presents for AWV/CPE with sudden onset severe pain in the left wrist that started the previous day. The patient woke up with the pain, which they describe as significantly worse than the usual morning soreness they experience due to sleeping with their hands turned in. The pain did not subside as it usually does, and the patient noticed swelling and redness in the wrist. The patient denies any recent trauma or activities that could have caused the pain. The patient has a history of gout, which they managed by avoiding certain foods and drinks, and has not had a flare-up in 20 years. 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 The patient is currently on amlodipine and losartan for hypertension, and citalopram for mood regulation.            Medications: Outpatient Medications Prior to Visit  Medication Sig   amLODipine (NORVASC) 5 MG tablet TAKE 1 TABLET(5 MG) BY MOUTH DAILY    atorvastatin (LIPITOR) 40 MG tablet TAKE 1 TABLET(40 MG) BY MOUTH DAILY AT 6 PM   citalopram (CELEXA) 20 MG tablet TAKE 1 TABLET(20 MG) BY MOUTH DAILY   fluorouracil (EFUDEX) 5 % cream Apply topically at bedtime. Qhs to wart and cover with bandaid   ipratropium-albuterol (DUONEB) 0.5-2.5 (3) MG/3ML SOLN Inhale into the lungs.   ketoconazole (NIZORAL) 2 % cream Apply 1 Application topically 3 (three) times a week. Apply to eyebrows and between eyebrows 3 nights weekly   losartan (COZAAR) 100 MG tablet Take 1 tablet (100 mg total) by mouth daily.   predniSONE (DELTASONE) 2.5 MG tablet Take by mouth.   tamsulosin (FLOMAX) 0.4 MG CAPS capsule Take 2 capsules (0.8 mg total) by mouth daily.   TIADYLT ER 300 MG 24 hr capsule TAKE 1 CAPSULE(300 MG) BY MOUTH DAILY   [DISCONTINUED] albuterol (VENTOLIN HFA) 108 (90 Base) MCG/ACT inhaler Inhale 2 puffs into the lungs every 6 (six) hours as needed for wheezing or shortness of breath.   [DISCONTINUED] Fluticasone-Umeclidin-Vilant (TRELEGY ELLIPTA) 100-62.5-25 MCG/ACT AEPB Inhale 1 puff into the lungs daily.   No facility-administered medications prior to visit.    No Known Allergies  Patient Care Team: Erasmo Downer, MD as PCP - General (Family Medicine) Deirdre Evener, MD (Dermatology) Irene Limbo., MD (Ophthalmology)  ROS      Objective  BP 101/64 (BP Location: Right  Arm, Patient Position: Sitting, Cuff Size: Normal)   Pulse 87   Ht 6' (1.829 m)   Wt 241 lb 11.2 oz (109.6 kg)   BMI 32.78 kg/m    Physical Exam Vitals reviewed.  Constitutional:      General: He is not in acute distress.    Appearance: Normal appearance. He is well-developed. He is not diaphoretic.  HENT:     Head: Normocephalic and atraumatic.     Right Ear: Tympanic membrane, ear canal and external ear normal.     Left Ear: Tympanic membrane, ear canal and external ear normal.     Nose: Nose normal.     Mouth/Throat:     Mouth: Mucous membranes are  moist.     Pharynx: Oropharynx is clear. No oropharyngeal exudate.  Eyes:     General: No scleral icterus.    Conjunctiva/sclera: Conjunctivae normal.     Pupils: Pupils are equal, round, and reactive to light.  Neck:     Thyroid: No thyromegaly.  Cardiovascular:     Rate and Rhythm: Normal rate and regular rhythm.     Pulses: Normal pulses.     Heart sounds: Normal heart sounds. No murmur heard. Pulmonary:     Effort: Pulmonary effort is normal. No respiratory distress.     Breath sounds: Normal breath sounds. No wheezing or rales.  Abdominal:     General: There is no distension.     Palpations: Abdomen is soft.     Tenderness: There is no abdominal tenderness.  Musculoskeletal:        General: Swelling present.     Cervical back: Neck supple.     Right lower leg: No edema.     Left lower leg: No edema.  Lymphadenopathy:     Cervical: No cervical adenopathy.  Skin:    General: Skin is warm and dry.     Findings: No rash.  Neurological:     Mental Status: He is alert and oriented to person, place, and time. Mental status is at baseline.     Gait: Gait normal.  Psychiatric:        Mood and Affect: Mood normal.        Behavior: Behavior normal.        Thought Content: Thought content normal.    Physical Exam   MUSCULOSKELETAL: Swelling and erythema across the top of the wrist. Pain upon supination, limited flexion and extension. Tenderness at distal radius, no tenderness at ulna, carpometacarpal joint, or metacarpals. Findings suggestive of gout.         Most recent functional status assessment:    03/17/2022    2:49 PM  In your present state of health, do you have any difficulty performing the following activities:  Hearing? 0  Vision? 0  Difficulty concentrating or making decisions? 0  Walking or climbing stairs? 1  Dressing or bathing? 0  Doing errands, shopping? 0   Most recent fall risk assessment:    09/09/2022    1:40 PM  Fall Risk   Falls in the past  year? 0  Injury with Fall? 0  Risk for fall due to : History of fall(s)  Follow up Falls evaluation completed    Most recent depression screenings:    09/09/2022    1:40 PM 03/17/2022    2:49 PM  PHQ 2/9 Scores  PHQ - 2 Score 0 0  PHQ- 9 Score  0   Most recent cognitive screening:    08/13/2021  8:47 AM  6CIT Screen  What Year? 0 points  What month? 0 points  What time? 0 points  Count back from 20 0 points  Months in reverse 0 points  Repeat phrase 0 points  Total Score 0 points   Most recent Audit-C alcohol use screening    03/17/2022    2:49 PM  Alcohol Use Disorder Test (AUDIT)  1. How often do you have a drink containing alcohol? 4  2. How many drinks containing alcohol do you have on a typical day when you are drinking? 1  3. How often do you have six or more drinks on one occasion? 0  AUDIT-C Score 5   A score of 3 or more in women, and 4 or more in men indicates increased risk for alcohol abuse, EXCEPT if all of the points are from question 1   Vision/Hearing Screen: No results found.    No results found for any visits on 09/09/22.    Assessment & Plan   Annual wellness visit done today including the all of the following: Reviewed patient's Family Medical History Reviewed and updated list of patient's medical providers Assessment of cognitive impairment was done Assessed patient's functional ability Established a written schedule for health screening services Health Risk Assessent Completed and Reviewed  Exercise Activities and Dietary recommendations  Goals      DIET - INCREASE WATER INTAKE     Recommend to drink at least 6-8 8oz glasses of water per day.        Immunization History  Administered Date(s) Administered   Fluad Quad(high Dose 65+) 01/26/2019, 01/14/2022   Hepatitis A, Adult 01/13/2011, 07/10/2011   Influenza, High Dose Seasonal PF 01/07/2020   PFIZER(Purple Top)SARS-COV-2 Vaccination 03/21/2019, 04/11/2019   Pneumococcal  Conjugate-13 10/20/2013   Pneumococcal Polysaccharide-23 02/08/2009, 11/22/2014, 01/10/2018   Tdap 01/03/2016   Zoster Recombinant(Shingrix) 03/19/2017, 08/20/2017   Zoster, Live 08/12/2008    Health Maintenance  Topic Date Due   COVID-19 Vaccine (3 - Pfizer risk series) 05/09/2019   Lung Cancer Screening  06/18/2022   INFLUENZA VACCINE  10/09/2022   Medicare Annual Wellness (AWV)  09/09/2023   DTaP/Tdap/Td (2 - Td or Tdap) 01/02/2026   Pneumonia Vaccine 57+ Years old  Completed   Zoster Vaccines- Shingrix  Completed   HPV VACCINES  Aged Out     Discussed health benefits of physical activity, and encouraged him to engage in regular exercise appropriate for his age and condition.    Problem List Items Addressed This Visit       Cardiovascular and Mediastinum   Hypertension    Well controlled on Amlodipine 5mg  daily and Losartan 100mg  daily.      Relevant Orders   Comprehensive metabolic panel     Musculoskeletal and Integument   Acute gout of left wrist   Relevant Medications   indomethacin (INDOCIN) 50 MG capsule   Other Relevant Orders   Uric acid     Other   Adjustment disorder    Stable on Citalopram.      Pure hypercholesterolemia    Well controlled on Atorvastatin. -Check lipid panel today.      Relevant Orders   Lipid Profile   Other Visit Diagnoses     Encounter for annual wellness visit (AWV) in Medicare patient    -  Primary   Relevant Orders   Comprehensive metabolic panel   Lipid Profile   Encounter for annual physical exam       Relevant Orders  Uric acid   Hemoglobin A1c   Hyperglycemia       Relevant Orders   Hemoglobin A1c          Acute Left Wrist Pain: Sudden onset of pain and swelling in the left wrist without any known trauma. History of gout. Pain and swelling localized to the distal radius. Limited range of motion in flexion and extension. -Start Indomethacin 50mg  TID for suspected gout flare. -Check uric acid level today  to confirm gout. -If uric acid level is not elevated and symptoms do not improve with Indomethacin, consider X-ray of the wrist.  General Health Maintenance: -Continue current medications including Amlodipine, Losartan, and Citalopram. -Consider COVID booster in the fall. -Check kidney and liver function today. -Check A1C today due to a slightly elevated blood sugar in the past. -Next visit in 6 months unless labs are abnormal or wrist pain does not improve.        Return in about 6 months (around 03/12/2023) for chronic disease f/u.     Shirlee Latch, MD

## 2022-09-10 LAB — COMPREHENSIVE METABOLIC PANEL
ALT: 15 IU/L (ref 0–44)
AST: 18 IU/L (ref 0–40)
Albumin: 4.3 g/dL (ref 3.8–4.8)
Alkaline Phosphatase: 98 IU/L (ref 44–121)
BUN/Creatinine Ratio: 16 (ref 10–24)
BUN: 25 mg/dL (ref 8–27)
Bilirubin Total: 1 mg/dL (ref 0.0–1.2)
CO2: 23 mmol/L (ref 20–29)
Calcium: 9.6 mg/dL (ref 8.6–10.2)
Chloride: 102 mmol/L (ref 96–106)
Creatinine, Ser: 1.52 mg/dL — ABNORMAL HIGH (ref 0.76–1.27)
Globulin, Total: 2.5 g/dL (ref 1.5–4.5)
Glucose: 101 mg/dL — ABNORMAL HIGH (ref 70–99)
Potassium: 4 mmol/L (ref 3.5–5.2)
Sodium: 140 mmol/L (ref 134–144)
Total Protein: 6.8 g/dL (ref 6.0–8.5)
eGFR: 46 mL/min/{1.73_m2} — ABNORMAL LOW (ref >59)

## 2022-09-10 LAB — LIPID PANEL
Chol/HDL Ratio: 2.2 ratio (ref 0.0–5.0)
Cholesterol, Total: 157 mg/dL (ref 100–199)
HDL: 73 mg/dL (ref >39)
LDL Chol Calc (NIH): 63 mg/dL (ref 0–99)
Triglycerides: 121 mg/dL (ref 0–149)
VLDL Cholesterol Cal: 21 mg/dL (ref 5–40)

## 2022-09-10 LAB — URIC ACID: Uric Acid: 5.3 mg/dL (ref 3.8–8.4)

## 2022-09-10 LAB — HEMOGLOBIN A1C
Est. average glucose Bld gHb Est-mCnc: 108 mg/dL
Hgb A1c MFr Bld: 5.4 % (ref 4.8–5.6)

## 2022-09-18 DIAGNOSIS — J449 Chronic obstructive pulmonary disease, unspecified: Secondary | ICD-10-CM | POA: Diagnosis not present

## 2022-09-22 ENCOUNTER — Other Ambulatory Visit: Payer: Self-pay | Admitting: Family Medicine

## 2022-09-22 DIAGNOSIS — N289 Disorder of kidney and ureter, unspecified: Secondary | ICD-10-CM

## 2022-09-22 DIAGNOSIS — I10 Essential (primary) hypertension: Secondary | ICD-10-CM

## 2022-09-22 NOTE — Telephone Encounter (Signed)
Requested Prescriptions  Pending Prescriptions Disp Refills   amLODipine (NORVASC) 5 MG tablet [Pharmacy Med Name: AMLODIPINE BESYLATE 5MG  TABLETS] 90 tablet 0    Sig: TAKE 1 TABLET(5 MG) BY MOUTH DAILY     Cardiovascular: Calcium Channel Blockers 2 Passed - 09/22/2022  9:09 AM      Passed - Last BP in normal range    BP Readings from Last 1 Encounters:  09/09/22 101/64         Passed - Last Heart Rate in normal range    Pulse Readings from Last 1 Encounters:  09/09/22 87         Passed - Valid encounter within last 6 months    Recent Outpatient Visits           1 week ago Encounter for annual wellness visit (AWV) in Medicare patient   [place] [facility] [place], Marzella Schlein, MD   6 months ago Primary hypertension   [place] [facility] [place], Marzella Schlein, MD   7 months ago Primary hypertension   [place] [facility] Erasmo Downer, MD   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

## 2022-09-23 ENCOUNTER — Other Ambulatory Visit: Payer: Self-pay | Admitting: Family Medicine

## 2022-10-02 DIAGNOSIS — D490 Neoplasm of unspecified behavior of digestive system: Secondary | ICD-10-CM | POA: Diagnosis not present

## 2022-10-02 DIAGNOSIS — I7143 Infrarenal abdominal aortic aneurysm, without rupture: Secondary | ICD-10-CM | POA: Diagnosis not present

## 2022-10-02 DIAGNOSIS — Z800 Family history of malignant neoplasm of digestive organs: Secondary | ICD-10-CM | POA: Diagnosis not present

## 2022-10-02 DIAGNOSIS — K862 Cyst of pancreas: Secondary | ICD-10-CM | POA: Diagnosis not present

## 2022-10-05 ENCOUNTER — Other Ambulatory Visit: Payer: Self-pay | Admitting: Family Medicine

## 2022-10-07 DIAGNOSIS — K862 Cyst of pancreas: Secondary | ICD-10-CM | POA: Diagnosis not present

## 2022-12-22 ENCOUNTER — Other Ambulatory Visit: Payer: Self-pay | Admitting: Family Medicine

## 2022-12-22 DIAGNOSIS — I10 Essential (primary) hypertension: Secondary | ICD-10-CM

## 2022-12-22 DIAGNOSIS — N289 Disorder of kidney and ureter, unspecified: Secondary | ICD-10-CM

## 2022-12-23 NOTE — Telephone Encounter (Signed)
Requested Prescriptions  Pending Prescriptions Disp Refills   amLODipine (NORVASC) 5 MG tablet [Pharmacy Med Name: AMLODIPINE BESYLATE 5MG  TABLETS] 90 tablet 0    Sig: TAKE 1 TABLET(5 MG) BY MOUTH DAILY     Cardiovascular: Calcium Channel Blockers 2 Passed - 12/22/2022  9:10 AM      Passed - Last BP in normal range    BP Readings from Last 1 Encounters:  09/09/22 101/64         Passed - Last Heart Rate in normal range    Pulse Readings from Last 1 Encounters:  09/09/22 87         Passed - Valid encounter within last 6 months    Recent Outpatient Visits           3 months ago Encounter for annual wellness visit (AWV) in Medicare patient   [place] [facility] [place], Marzella Schlein, MD   9 months ago Primary hypertension   [place] [facility] [place], Marzella Schlein, MD   10 months ago Primary hypertension   [place] [facility] Erasmo Downer, MD   11 months ago BPH associated with nocturia   [facility], Marzella Schlein, MD   1 year ago COVID-19   [facility], PA-C       Future Appointments             In 1 month Gwen Pounds Dineen Kid, MD [facility]   In 2 months Bacigalupo, Marzella Schlein, MD [facility]

## 2022-12-24 ENCOUNTER — Ambulatory Visit: Payer: Medicare Other | Admitting: Dermatology

## 2023-01-20 ENCOUNTER — Other Ambulatory Visit: Payer: Self-pay | Admitting: Family Medicine

## 2023-01-20 NOTE — Telephone Encounter (Signed)
Please advise 

## 2023-01-22 DIAGNOSIS — K080 Exfoliation of teeth due to systemic causes: Secondary | ICD-10-CM | POA: Diagnosis not present

## 2023-01-27 ENCOUNTER — Ambulatory Visit: Payer: Medicare Other | Admitting: Dermatology

## 2023-01-27 DIAGNOSIS — L711 Rhinophyma: Secondary | ICD-10-CM

## 2023-01-27 DIAGNOSIS — W908XXA Exposure to other nonionizing radiation, initial encounter: Secondary | ICD-10-CM

## 2023-01-27 DIAGNOSIS — L718 Other rosacea: Secondary | ICD-10-CM

## 2023-01-27 DIAGNOSIS — L570 Actinic keratosis: Secondary | ICD-10-CM

## 2023-01-27 DIAGNOSIS — Z7189 Other specified counseling: Secondary | ICD-10-CM

## 2023-01-27 DIAGNOSIS — L820 Inflamed seborrheic keratosis: Secondary | ICD-10-CM

## 2023-01-27 DIAGNOSIS — L578 Other skin changes due to chronic exposure to nonionizing radiation: Secondary | ICD-10-CM | POA: Diagnosis not present

## 2023-01-27 DIAGNOSIS — Z872 Personal history of diseases of the skin and subcutaneous tissue: Secondary | ICD-10-CM

## 2023-01-27 DIAGNOSIS — L219 Seborrheic dermatitis, unspecified: Secondary | ICD-10-CM

## 2023-01-27 DIAGNOSIS — L738 Other specified follicular disorders: Secondary | ICD-10-CM

## 2023-01-27 DIAGNOSIS — Z79899 Other long term (current) drug therapy: Secondary | ICD-10-CM

## 2023-01-27 DIAGNOSIS — L719 Rosacea, unspecified: Secondary | ICD-10-CM

## 2023-01-27 MED ORDER — METRONIDAZOLE 0.75 % EX GEL: 1.0000 | Freq: Every evening | CUTANEOUS | 11 refills | Status: DC

## 2023-01-27 NOTE — Progress Notes (Signed)
Follow-Up Visit   Subjective  Isaac Watkins is a 82 y.o. male who presents for the following: AK 69m f/u face, Wart L 4th finger subungual at the eponichium 7m f/u, check spot chest, 56m, no symptoms The patient has spots, moles and lesions to be evaluated, some may be new or changing and the patient may have concern these could be cancer.   The following portions of the chart were reviewed this encounter and updated as appropriate: medications, allergies, medical history  Review of Systems:  No other skin or systemic complaints except as noted in HPI or Assessment and Plan.  Objective  Well appearing patient in no apparent distress; mood and affect are within normal limits.   A focused examination was performed of the following areas: Face, right hand, chest  Relevant exam findings are noted in the Assessment and Plan.  L cheek x 1 Stuck on waxy paps with erythema  R face x 1, L cheek x 2 (3) Pink scaly macules    Assessment & Plan   ACTINIC DAMAGE - chronic, secondary to cumulative UV radiation exposure/sun exposure over time - diffuse scaly erythematous macules with underlying dyspigmentation - Recommend daily broad spectrum sunscreen SPF 30+ to sun-exposed areas, reapply every 2 hours as needed.  - Recommend staying in the shade or wearing long sleeves, sun glasses (UVA+UVB protection) and wide brim hats (4-inch brim around the entire circumference of the hat). - Call for new or changing lesions.   Inflamed seborrheic keratosis L cheek x 1  Symptomatic, irritating, patient would like treated.  Destruction of lesion - L cheek x 1 Complexity: simple   Destruction method: cryotherapy   Informed consent: discussed and consent obtained   Timeout:  patient name, date of birth, surgical site, and procedure verified Lesion destroyed using liquid nitrogen: Yes   Region frozen until ice ball extended beyond lesion: Yes   Outcome: patient tolerated procedure well  with no complications   Post-procedure details: wound care instructions given    AK (actinic keratosis) (3) R face x 1, L cheek x 2  Actinic keratoses are precancerous spots that appear secondary to cumulative UV radiation exposure/sun exposure over time. They are chronic with expected duration over 1 year. A portion of actinic keratoses will progress to squamous cell carcinoma of the skin. It is not possible to reliably predict which spots will progress to skin cancer and so treatment is recommended to prevent development of skin cancer.  Recommend daily broad spectrum sunscreen SPF 30+ to sun-exposed areas, reapply every 2 hours as needed.  Recommend staying in the shade or wearing long sleeves, sun glasses (UVA+UVB protection) and wide brim hats (4-inch brim around the entire circumference of the hat). Call for new or changing lesions.  Destruction of lesion - R face x 1, L cheek x 2 (3) Complexity: simple   Destruction method: cryotherapy   Informed consent: discussed and consent obtained   Timeout:  patient name, date of birth, surgical site, and procedure verified Lesion destroyed using liquid nitrogen: Yes   Region frozen until ice ball extended beyond lesion: Yes   Outcome: patient tolerated procedure well with no complications   Post-procedure details: wound care instructions given    SEBORRHEIC DERMATITIS Sternum, face, post auriculars Exam: Pink scaly patch  Chronic and persistent condition with duration or expected duration over one year. Condition is symptomatic / bothersome to patient. Not to goal.   Seborrheic Dermatitis is a chronic persistent rash characterized by pinkness  and scaling most commonly of the mid face but also can occur on the scalp (dandruff), ears; mid chest, mid back and groin.  It tends to be exacerbated by stress and cooler weather.  People who have neurologic disease may experience new onset or exacerbation of existing seborrheic dermatitis.  The  condition is not curable but treatable and can be controlled.  Treatment Plan: Start Ketoconazole 2% cr 2-3d/wk to chest Cont Ketoconazole 2% cr 3d/wk to face, post auriculars  Long term medication management.  Patient is using long term (months to years) prescription medication  to control their dermatologic condition.  These medications require periodic monitoring to evaluate for efficacy and side effects and may require periodic laboratory monitoring.   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 Condition is symptomatic/ bothersome to patient. Not currently at goal.  Rosacea is a chronic progressive skin condition usually affecting the face of adults, causing redness and/or acne bumps. It is treatable but not curable. It sometimes affects the eyes (ocular rosacea) as well. It may respond to topical and/or systemic medication and can flare with stress, sun exposure, alcohol, exercise, topical steroids (including hydrocortisone/cortisone 10) and some foods.  Daily application of broad spectrum spf 30+ sunscreen to face is recommended to reduce flares.  Treatment Plan Discussed Isotretinoin and side effects, pt declines. Also discussed cosmetic surgery, laser and topical and oral treatment. Start Metrogel 0.75% qhs to face  Long term medication management.  Patient is using long term (months to years) prescription medication  to control their dermatologic condition.  These medications require periodic monitoring to evaluate for efficacy and side effects and may require periodic laboratory monitoring.  HISTORY OF WART L 4th finger subungual at the eponichium Exam: finger clear today  Treatment Plan: Resolved with LN2 treatment    Return in about 1 year (around 01/27/2024)  for UBSE, Hx of AKs.  I, Ardis Rowan, RMA, am acting as scribe for Armida Sans, MD .   Documentation: I have reviewed the above documentation for accuracy and completeness, and I agree with the above.  Armida Sans, MD

## 2023-01-27 NOTE — Patient Instructions (Addendum)
Cryotherapy Aftercare  Wash gently with soap and water everyday.   Apply Vaseline and Band-Aid daily until healed.    Ketoconazole 2% cr 2-3 nights a week to red scaly areas face, eyebrows, behind ears and chest  Start Metrogel nightly to nose and cheeks where you get the pimples    Due to recent changes in healthcare laws, you may see results of your pathology and/or laboratory studies on MyChart before the doctors have had a chance to review them. We understand that in some cases there may be results that are confusing or concerning to you. Please understand that not all results are received at the same time and often the doctors may need to interpret multiple results in order to provide you with the best plan of care or course of treatment. Therefore, we ask that you please give [place] 2 business days to thoroughly review all your results before contacting the office for clarification. Should we see a critical lab result, you will be contacted sooner.   If You Need Anything After Your Visit  If you have any questions or concerns for your doctor, please call our main line at [phone] and press option 4 to reach your doctor's medical assistant. If no one answers, please leave a voicemail as directed and we will return your call as soon as possible. Messages left after 4 pm will be answered the following business day.   You may also send [place] a message via MyChart. We typically respond to MyChart messages within 1-2 business days.  For prescription refills, please ask your pharmacy to contact our office. Our fax number is [phone].  If you have an urgent issue when the clinic is closed that cannot wait until the next business day, you can page your doctor at the number below.    Please note that while we do our best to be available for urgent issues outside of office hours, we are not available 24/7.   If you have an urgent issue and are unable to reach [place], you may choose to seek medical  care at your doctor's office, retail clinic, urgent care center, or emergency room.  If you have a medical emergency, please immediately call 911 or go to the emergency department.  Pager Numbers  - Dr. Gwen Pounds: [phone]  - Dr. Roseanne Reno: [phone]  - Dr. Katrinka Blazing: [phone]   In the event of inclement weather, please call our main line at [phone] for an update on the status of any delays or closures.  Dermatology Medication Tips: Please keep the boxes that topical medications come in in order to help keep track of the instructions about where and how to use these. Pharmacies typically print the medication instructions only on the boxes and not directly on the medication tubes.   If your medication is too expensive, please contact our office at [phone] option 4 or send [place] a message through MyChart.   We are unable to tell what your co-pay for medications will be in advance as this is different depending on your insurance coverage. However, we may be able to find a substitute medication at lower cost or fill out paperwork to get insurance to cover a needed medication.   If a prior authorization is required to get your medication covered by your insurance company, please allow [place] 1-2 business days to complete this process.  Drug prices often vary depending on where the prescription is filled and some pharmacies may offer cheaper prices.  The website www.goodrx.com contains  coupons for medications through different pharmacies. The prices here do not account for what the cost may be with help from insurance (it may be cheaper with your insurance), but the website can give you the price if you did not use any insurance.  - You can print the associated coupon and take it with your prescription to the pharmacy.  - You may also stop by our office during regular business hours and pick up a GoodRx coupon card.  - If you need your prescription sent electronically to a different  pharmacy, notify our office through [facility] or by phone at [phone] option 4.     Si Usted Necesita Algo Despus de Su Visita  Tambin puede enviarnos un mensaje a travs de Clinical cytogeneticist. Por lo general respondemos a los mensajes de MyChart en el transcurso de 1 a 2 das hbiles.  Para renovar recetas, por favor pida a su farmacia que se ponga en contacto con nuestra oficina. Annie Sable de fax es [place] [phone].  Si tiene un asunto urgente cuando la clnica est cerrada y que no puede esperar hasta el siguiente da hbil, puede llamar/localizar a su doctor(a) al nmero que aparece a continuacin.   Por favor, tenga en cuenta que aunque hacemos todo lo posible para estar disponibles para asuntos urgentes fuera del horario de [place], no estamos disponibles las 24 horas del da, los 7 [address]  [address] de la [place].   Si tiene un problema urgente y no puede comunicarse con nosotros, puede optar por buscar atencin mdica  en el consultorio de su doctor(a), en una clnica privada, en un centro de atencin urgente o en una sala de emergencias.  Si tiene Engineer, drilling, por favor llame inmediatamente al 911 o vaya a la sala de emergencias.  Nmeros de bper  - Dr. Gwen Pounds: [phone]  - Dra. Roseanne Reno: [phone]  - Dr. Katrinka Blazing: [phone]   En caso de inclemencias del tiempo, por favor llame a Lacy Duverney principal al [phone] para una actualizacin sobre el [place] de cualquier retraso o cierre.  Consejos para la medicacin en dermatologa: Por favor, guarde las cajas en las que vienen los medicamentos de uso tpico para ayudarle a seguir las instrucciones sobre dnde y cmo usarlos. Las farmacias generalmente imprimen las instrucciones del medicamento slo en las cajas y no directamente en los tubos del [place].   Si su medicamento es muy caro, por favor, pngase en contacto con Rolm Gala llamando al [phone] y presione la opcin 4 o envenos un mensaje a  travs de Clinical cytogeneticist.   No podemos decirle cul ser su copago por los medicamentos por adelantado ya que esto es diferente dependiendo de la cobertura de su seguro. Sin embargo, es posible que podamos encontrar un medicamento sustituto a Audiological scientist un formulario para que el seguro cubra el medicamento que se considera necesario.   Si se requiere una autorizacin previa para que su compaa de seguros [place] su medicamento, por favor permtanos de 1 a 2 das hbiles para completar [address]  Los precios de los medicamentos varan con frecuencia dependiendo del Environmental consultant de dnde se surte la receta y alguna farmacias pueden ofrecer precios ms baratos.  El sitio web www.goodrx.com tiene cupones para medicamentos de Health and safety inspector. Los precios aqu no tienen en cuenta lo que podra costar con la ayuda del seguro (puede ser ms barato con su seguro), pero el sitio web puede darle el precio si no utiliz Tourist information centre manager.  - Puede imprimir el  cupn correspondiente y llevarlo con su receta a la farmacia.  - Tambin puede pasar por nuestra oficina durante el horario de atencin regular y Education officer, museum una tarjeta de cupones de GoodRx.  - Si necesita que su receta se enve electrnicamente a una farmacia diferente, informe a nuestra oficina a travs de MyChart de [place] o por telfono llamando al [phone] y presione la opcin 4.

## 2023-01-30 ENCOUNTER — Telehealth: Payer: Self-pay | Admitting: Family Medicine

## 2023-01-30 MED ORDER — ATORVASTATIN CALCIUM 40 MG PO TABS: ORAL_TABLET | ORAL | 1 refills | Status: DC

## 2023-01-30 NOTE — Telephone Encounter (Signed)
[facility] needs refill authorizations on Atorvastatin 40 mg. #90

## 2023-01-30 NOTE — Addendum Note (Signed)
Addended by: Shirley Muscat on: 01/30/2023 02:47 PM   Modules accepted: Orders

## 2023-01-31 ENCOUNTER — Encounter: Payer: Self-pay | Admitting: Dermatology

## 2023-02-24 ENCOUNTER — Other Ambulatory Visit: Payer: Self-pay | Admitting: Family Medicine

## 2023-02-24 NOTE — Telephone Encounter (Signed)
Received fax from [facility] [address]. Asking for refill authorizations for Atrovastatin 40 mg. #90

## 2023-02-24 NOTE — Telephone Encounter (Signed)
Requested Prescriptions  Pending Prescriptions Disp Refills   losartan (COZAAR) 100 MG tablet [Pharmacy Med Name: LOSARTAN 100MG  TABLETS] 90 tablet 1    Sig: TAKE 1 TABLET(100 MG) BY MOUTH DAILY     Cardiovascular:  Angiotensin Receptor Blockers Failed - 02/24/2023  3:49 PM      Failed - Cr in normal range and within 180 days    Creatinine, Ser  Date Value Ref Range Status  09/09/2022 1.52 (H) 0.76 - 1.27 mg/dL Final         Passed - K in normal range and within 180 days    Potassium  Date Value Ref Range Status  09/09/2022 4.0 3.5 - 5.2 mmol/L Final         Passed - Patient is not pregnant      Passed - Last BP in normal range    BP Readings from Last 1 Encounters:  09/09/22 101/64         Passed - Valid encounter within last 6 months    Recent Outpatient Visits           5 months ago Encounter for annual wellness visit (AWV) in Medicare patient   [place] [facility] [place], Marzella Schlein, MD   11 months ago Primary hypertension   [place] [facility] [place], Marzella Schlein, MD   1 year ago Primary hypertension   [place] [facility] [place], Marzella Schlein, MD   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

## 2023-02-24 NOTE — Telephone Encounter (Signed)
Requested Prescriptions  Refused Prescriptions Disp Refills   losartan (COZAAR) 100 MG tablet [Pharmacy Med Name: LOSARTAN 100MG  TABLETS] 90 tablet 1    Sig: TAKE 1 TABLET(100 MG) BY MOUTH DAILY     Cardiovascular:  Angiotensin Receptor Blockers Failed - 02/24/2023  3:58 PM      Failed - Cr in normal range and within 180 days    Creatinine, Ser  Date Value Ref Range Status  09/09/2022 1.52 (H) 0.76 - 1.27 mg/dL Final         Passed - K in normal range and within 180 days    Potassium  Date Value Ref Range Status  09/09/2022 4.0 3.5 - 5.2 mmol/L Final         Passed - Patient is not pregnant      Passed - Last BP in normal range    BP Readings from Last 1 Encounters:  09/09/22 101/64         Passed - Valid encounter within last 6 months    Recent Outpatient Visits           5 months ago Encounter for annual wellness visit (AWV) in Medicare patient   [place] [facility] [place], Marzella Schlein, MD   11 months ago Primary hypertension   [place] [facility] [place], Marzella Schlein, MD   1 year ago Primary hypertension   [place] [facility] [place], Marzella Schlein, MD   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

## 2023-03-02 ENCOUNTER — Other Ambulatory Visit: Payer: Self-pay

## 2023-03-02 ENCOUNTER — Telehealth: Payer: Self-pay | Admitting: Family Medicine

## 2023-03-02 MED ORDER — ATORVASTATIN CALCIUM 40 MG PO TABS: ORAL_TABLET | ORAL | 1 refills | Status: DC

## 2023-03-02 NOTE — Telephone Encounter (Signed)
Received fax from [facility] asking for refills for Atorvastatin 40 mg. #90

## 2023-03-08 ENCOUNTER — Encounter: Payer: Self-pay | Admitting: Pharmacist

## 2023-03-08 NOTE — Progress Notes (Signed)
Pharmacy Quality Measure Review  This patient is appearing on a report for being at risk of failing the adherence measure for cholesterol (statin) medications this calendar year.   Medication: atorvastatin 40 mg Last fill date: 12/23 for 90 day supply  Insurance report was not up to date. No action needed at this time.   Jarrett Ables, PharmD PGY-1 Pharmacy Resident

## 2023-03-12 ENCOUNTER — Ambulatory Visit: Payer: Medicare Other | Admitting: Family Medicine

## 2023-03-22 ENCOUNTER — Other Ambulatory Visit: Payer: Self-pay | Admitting: Family Medicine

## 2023-03-22 DIAGNOSIS — I10 Essential (primary) hypertension: Secondary | ICD-10-CM

## 2023-03-22 DIAGNOSIS — N289 Disorder of kidney and ureter, unspecified: Secondary | ICD-10-CM

## 2023-03-23 ENCOUNTER — Ambulatory Visit: Payer: Medicare Other | Admitting: Family Medicine

## 2023-03-24 NOTE — Telephone Encounter (Signed)
 Requested by interface surescripts. Courtesy refill . Future visit in 1 week .  Requested Prescriptions  Pending Prescriptions Disp Refills   amLODipine  (NORVASC ) 5 MG tablet [Pharmacy Med Name: AMLODIPINE  BESYLATE 5MG  TABLETS] 90 tablet 0    Sig: TAKE 1 TABLET(5 MG) BY MOUTH DAILY     Cardiovascular: Calcium  Channel Blockers 2 Failed - 03/24/2023 11:33 AM      Failed - Valid encounter within last 6 months    Recent Outpatient Visits           6 months ago Encounter for annual wellness visit (AWV) in Medicare patient   [place] [facility] [place], Jon HERO, MD   1 year ago Primary hypertension   [place] [place], Jon HERO, MD   1 year ago Primary hypertension   [place] [facility] [place], Jon HERO, MD   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 Cyndi Shaver, PA-C       Future Appointments             In 1 week Bacigalupo, Jon HERO, MD [facility]   In 10 months Hester Alm BROCKS, MD [facility]            Passed - Last BP in normal range    BP Readings from Last 1 Encounters:  09/09/22 101/64         Passed - Last Heart Rate in normal range    Pulse Readings from Last 1 Encounters:  09/09/22 87

## 2023-03-27 ENCOUNTER — Other Ambulatory Visit: Payer: Self-pay | Admitting: Family Medicine

## 2023-03-27 NOTE — Telephone Encounter (Signed)
Requested medications are due for refill today.  yes  Requested medications are on the active medications list.  yes  Last refill. 09/23/2022 #180 1 rf  Future visit scheduled.   yes  Notes to clinic.  Expired labs.    Requested Prescriptions  Pending Prescriptions Disp Refills   tamsulosin (FLOMAX) 0.4 MG CAPS capsule [Pharmacy Med Name: TAMSULOSIN 0.4MG  CAPSULES] 180 capsule 1    Sig: TAKE 2 CAPSULES(0.8 MG) BY MOUTH DAILY     Urology: Alpha-Adrenergic Blocker Failed - 03/27/2023  2:46 PM      Failed - PSA in normal range and within 360 days    Prostate Specific Ag, Serum  Date Value Ref Range Status  01/14/2022 3.9 0.0 - 4.0 ng/mL Final    Comment:    Roche ECLIA methodology. According to the American Urological Association, Serum PSA should decrease and remain at undetectable levels after radical prostatectomy. The AUA defines biochemical recurrence as an initial PSA value 0.2 ng/mL or greater followed by a subsequent confirmatory PSA value 0.2 ng/mL or greater. Values obtained with different assay methods or kits cannot be used interchangeably. Results cannot be interpreted as absolute evidence of the presence or absence of malignant disease.          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

## 2023-03-31 ENCOUNTER — Encounter: Payer: Self-pay | Admitting: Family Medicine

## 2023-03-31 ENCOUNTER — Ambulatory Visit (INDEPENDENT_AMBULATORY_CARE_PROVIDER_SITE_OTHER): Payer: Medicare Other | Admitting: Family Medicine

## 2023-03-31 VITALS — BP 103/59 | HR 70 | Ht 73.0 in | Wt 232.5 lb

## 2023-03-31 DIAGNOSIS — F4323 Adjustment disorder with mixed anxiety and depressed mood: Secondary | ICD-10-CM

## 2023-03-31 DIAGNOSIS — I700 Atherosclerosis of aorta: Secondary | ICD-10-CM | POA: Diagnosis not present

## 2023-03-31 DIAGNOSIS — I10 Essential (primary) hypertension: Secondary | ICD-10-CM

## 2023-03-31 DIAGNOSIS — E7800 Pure hypercholesterolemia, unspecified: Secondary | ICD-10-CM

## 2023-03-31 DIAGNOSIS — Z87891 Personal history of nicotine dependence: Secondary | ICD-10-CM

## 2023-03-31 DIAGNOSIS — J439 Emphysema, unspecified: Secondary | ICD-10-CM | POA: Diagnosis not present

## 2023-03-31 DIAGNOSIS — I7143 Infrarenal abdominal aortic aneurysm, without rupture: Secondary | ICD-10-CM

## 2023-03-31 NOTE — Assessment & Plan Note (Signed)
Continue statin. 

## 2023-03-31 NOTE — Assessment & Plan Note (Signed)
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 Last MRI was in late 2023. Discussed potential issues with image accessibility for [facility] team if done locally. He will inquire with [facility] about local imaging options. - Inquire with [facility] about local MRI options - Proceed with [facility] at [facility] if local imaging is not feasible

## 2023-03-31 NOTE — Assessment & Plan Note (Signed)
Blood pressure currently on the lower side, possibly due to 10-pound weight loss. Reports occasional nocturnal lightheadedness. Home blood pressure readings were 120/80 mmHg a month ago. Advised monitoring blood pressure at home to determine if medication adjustments are needed. - Monitor blood pressure at home every couple of days for the next week or two - Report low readings - Consider stopping amlodipine if blood pressure remains low

## 2023-03-31 NOTE — Assessment & Plan Note (Signed)
On citalopram for mood management, reports feeling well-controlled with occasional irritability. - Continue citalopram as prescribed

## 2023-03-31 NOTE — Assessment & Plan Note (Signed)
Currently on atorvastatin with no issues reported. - Continue atorvastatin as prescribed

## 2023-03-31 NOTE — Assessment & Plan Note (Signed)
-   No current exacerbation.

## 2023-03-31 NOTE — Progress Notes (Signed)
Established patient visit   Patient: Isaac Watkins   DOB: 06/29/1940   83 y.o. Male  MRN: [id number] Visit Date: 03/31/2023  Today's healthcare provider: Shirlee Latch, MD   Chief Complaint  Patient presents with   Medical Management of Chronic Issues    6 month follow-up   Hypertension   Hyperlipidemia   Subjective    HPI HPI     Medical Management of Chronic Issues    Additional comments: 6 month follow-up        Hypertension   The problem is controlled.  Compliance with treatment is excellent.  Syncope: Present.        Hyperlipidemia   The problem is controlled.  Compliance with treatment is excellent.      Last edited by Acey Lav, CMA on 03/31/2023  1:03 PM.       Discussed the use of AI scribe software for clinical note transcription with the patient, who gave verbal consent to proceed.  History of Present Illness   The patient, an 83 year old man with a history of an aortic aneurysm and a pancreatic condition, presents for a routine follow-up. He reports a weight loss of about 10 pounds since his last visit, which he attributes to the increased physical activity and stress associated with caregiving for his wife, Isaac Watkins. He also mentions occasional lightheadedness when getting up at night to urinate. He is scheduled for an MRI at [facility] for further evaluation of his aneurysm and pancreas. He is currently on amlodipine, losartan, and Lipitor, and reports no adverse effects from these medications. He is also due for a lung cancer screening.       Medications: Outpatient Medications Prior to Visit  Medication Sig   amLODipine (NORVASC) 5 MG tablet TAKE 1 TABLET(5 MG) BY MOUTH DAILY   atorvastatin (LIPITOR) 40 MG tablet Take 1 tablet (40 MG) by mouth daily at 6 PM   citalopram (CELEXA) 20 MG tablet TAKE 1 TABLET(20 MG) BY MOUTH DAILY   ipratropium-albuterol (DUONEB) 0.5-2.5 (3) MG/3ML SOLN Inhale into the lungs.   ketoconazole (NIZORAL) 2 %  cream Apply 1 Application topically 3 (three) times a week. Apply to eyebrows and between eyebrows 3 nights weekly   losartan (COZAAR) 100 MG tablet TAKE 1 TABLET(100 MG) BY MOUTH DAILY   metroNIDAZOLE (METROGEL) 0.75 % gel Apply 1 Application topically at bedtime. Apply to face nightly for Rosacea   predniSONE (DELTASONE) 2.5 MG tablet Take by mouth.   tamsulosin (FLOMAX) 0.4 MG CAPS capsule TAKE 2 CAPSULES(0.8 MG) BY MOUTH DAILY   TIADYLT ER 300 MG 24 hr capsule TAKE 1 CAPSULE(300 MG) BY MOUTH DAILY   [DISCONTINUED] fluorouracil (EFUDEX) 5 % cream Apply topically at bedtime. Qhs to wart and cover with bandaid   No facility-administered medications prior to visit.    Review of Systems     Objective    BP (!) 103/59 (BP Location: Left Arm, Patient Position: Sitting, Cuff Size: Normal) Comment: MAP 71  Pulse 70   Ht 6\' 1"  (1.854 m)   Wt 232 lb 8 oz (105.5 kg)   SpO2 99%   BMI 30.67 kg/m    Physical Exam Vitals reviewed.  Constitutional:      General: He is not in acute distress.    Appearance: Normal appearance. He is not diaphoretic.  HENT:     Head: Normocephalic and atraumatic.  Eyes:     General: No scleral icterus.    Conjunctiva/sclera: Conjunctivae normal.  Cardiovascular:  Rate and Rhythm: Normal rate and regular rhythm.     Heart sounds: Normal heart sounds. No murmur heard. Pulmonary:     Effort: Pulmonary effort is normal. No respiratory distress.     Breath sounds: Normal breath sounds. No wheezing or rhonchi.  Musculoskeletal:     Cervical back: Neck supple.     Right lower leg: No edema.     Left lower leg: No edema.  Lymphadenopathy:     Cervical: No cervical adenopathy.  Skin:    General: Skin is warm and dry.     Findings: No rash.  Neurological:     Mental Status: He is alert and oriented to person, place, and time. Mental status is at baseline.  Psychiatric:        Mood and Affect: Mood normal.        Behavior: Behavior normal.      No  results found for any visits on 03/31/23.  Assessment & Plan     Problem List Items Addressed This Visit       Cardiovascular and Mediastinum   Hypertension - Primary   Aortic atherosclerosis (HCC)   Infrarenal abdominal aortic aneurysm (AAA) without rupture (HCC)     Respiratory   Pulmonary emphysema (HCC)     Other   Adjustment disorder   Pure hypercholesterolemia   Other Visit Diagnoses       History of tobacco use, presenting hazards to health       Relevant Orders   Ambulatory Referral Lung Cancer Screening [facility]           Pancreatic Lesion Scheduled for an MRI to evaluate a pancreatic lesion. Biopsy previously done at [facility]. Experienced a weight loss of about 10 pounds since July, likely due to increased caregiving responsibilities. - Proceed with MRI as scheduled  Lung Cancer Screening Due for a lung cancer screening CT scan, as it has been almost two years since the last scan. - Send referral for lung cancer screening CT scan - Confirm insurance coverage for the scan  General Health Maintenance Due for routine labs, but deferred as labs will be done at [facility]. Discussed physical activity, noting caregiving responsibilities have limited exercise. - Defer routine labs until after [facility] labs are completed - Schedule next physical exam for summer  Follow-up - Schedule six-month follow-up visit for summer - Follow up on blood pressure readings if low - Review MRI results at next visit.        Return in about 6 months (around 09/28/2023) for CPE.       Shirlee Latch, MD  [facility] [phone] (phone) [phone] (fax)  [facility]

## 2023-04-09 DIAGNOSIS — I7143 Infrarenal abdominal aortic aneurysm, without rupture: Secondary | ICD-10-CM | POA: Diagnosis not present

## 2023-04-09 DIAGNOSIS — K862 Cyst of pancreas: Secondary | ICD-10-CM | POA: Diagnosis not present

## 2023-06-11 ENCOUNTER — Other Ambulatory Visit: Payer: Self-pay | Admitting: Family Medicine

## 2023-06-12 NOTE — Telephone Encounter (Signed)
 Requested medication (s) are due for refill today: yes  Requested medication (s) are on the active medication list: yes  Last refill:  02/24/23 #90/0  Future visit scheduled: yes  Notes to clinic:  Unable to refill per protocol due to failed labs, no updated results.      Requested Prescriptions  Pending Prescriptions Disp Refills   losartan (COZAAR) 100 MG tablet [Pharmacy Med Name: LOSARTAN 100MG  TABLETS] 90 tablet 0    Sig: TAKE 1 TABLET(100 MG) BY MOUTH DAILY     Cardiovascular:  Angiotensin Receptor Blockers Failed - 06/12/2023  1:41 PM      Failed - Cr in normal range and within 180 days    Creatinine, Ser  Date Value Ref Range Status  09/09/2022 1.52 (H) 0.76 - 1.27 mg/dL Final         Failed - K in normal range and within 180 days    Potassium  Date Value Ref Range Status  09/09/2022 4.0 3.5 - 5.2 mmol/L Final         Failed - Valid encounter within last 6 months    Recent Outpatient Visits   None     Future Appointments             In 4 months Bacigalupo, Marzella Schlein, MD [facility]   In 7 months Deirdre Evener, MD [facility]            Passed - Patient is not pregnant      Passed - Last BP in normal range    BP Readings from Last 1 Encounters:  03/31/23 (!) 103/59

## 2023-06-24 DIAGNOSIS — Z961 Presence of intraocular lens: Secondary | ICD-10-CM | POA: Diagnosis not present

## 2023-06-24 DIAGNOSIS — H524 Presbyopia: Secondary | ICD-10-CM | POA: Diagnosis not present

## 2023-07-01 ENCOUNTER — Other Ambulatory Visit: Payer: Self-pay | Admitting: Family Medicine

## 2023-07-01 NOTE — Telephone Encounter (Signed)
 Requested Prescriptions  Pending Prescriptions Disp Refills   diltiazem  (TIADYLT  ER) 300 MG 24 hr capsule [Pharmacy Med Name: TIADYLT  ER 300MG  CAPSULES (24 HR)] 90 capsule 0    Sig: TAKE 1 CAPSULE(300 MG) BY MOUTH DAILY     Cardiovascular: Calcium  Channel Blockers 3 Failed - 07/01/2023  4:16 PM      Failed - Cr in normal range and within 360 days    Creatinine, Ser  Date Value Ref Range Status  09/09/2022 1.52 (H) 0.76 - 1.27 mg/dL Final         Failed - Valid encounter within last 6 months    Recent Outpatient Visits   None     Future Appointments             In 3 months Bacigalupo, Stan Eans, MD [facility]   In 7 months Elta Halter, MD [facility]            Passed - ALT in normal range and within 360 days    ALT  Date Value Ref Range Status  09/09/2022 15 0 - 44 IU/L Final         Passed - AST in normal range and within 360 days    AST  Date Value Ref Range Status  09/09/2022 18 0 - 40 IU/L Final         Passed - Last BP in normal range    BP Readings from Last 1 Encounters:  03/31/23 (!) 103/59         Passed - Last Heart Rate in normal range    Pulse Readings from Last 1 Encounters:  03/31/23 70

## 2023-07-20 ENCOUNTER — Other Ambulatory Visit: Payer: Self-pay | Admitting: Family Medicine

## 2023-07-20 DIAGNOSIS — N289 Disorder of kidney and ureter, unspecified: Secondary | ICD-10-CM

## 2023-07-20 DIAGNOSIS — I10 Essential (primary) hypertension: Secondary | ICD-10-CM

## 2023-08-13 ENCOUNTER — Encounter: Payer: Self-pay | Admitting: Family Medicine

## 2023-08-13 ENCOUNTER — Ambulatory Visit (INDEPENDENT_AMBULATORY_CARE_PROVIDER_SITE_OTHER): Admitting: Family Medicine

## 2023-08-13 VITALS — BP 121/69 | HR 55 | Ht 73.0 in | Wt 234.0 lb

## 2023-08-13 DIAGNOSIS — N5089 Other specified disorders of the male genital organs: Secondary | ICD-10-CM
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
## 2023-08-25 DIAGNOSIS — I10 Essential (primary) hypertension: Secondary | ICD-10-CM | POA: Diagnosis not present

## 2023-08-25 DIAGNOSIS — Z87891 Personal history of nicotine dependence: Secondary | ICD-10-CM | POA: Diagnosis not present

## 2023-08-25 DIAGNOSIS — J449 Chronic obstructive pulmonary disease, unspecified: Secondary | ICD-10-CM | POA: Diagnosis not present

## 2023-08-25 DIAGNOSIS — Z131 Encounter for screening for diabetes mellitus: Secondary | ICD-10-CM | POA: Diagnosis not present

## 2023-08-25 DIAGNOSIS — D490 Neoplasm of unspecified behavior of digestive system: Secondary | ICD-10-CM | POA: Diagnosis not present

## 2023-08-25 DIAGNOSIS — E782 Mixed hyperlipidemia: Secondary | ICD-10-CM | POA: Diagnosis not present

## 2023-08-25 DIAGNOSIS — N1831 Chronic kidney disease, stage 3a: Secondary | ICD-10-CM | POA: Diagnosis not present

## 2023-08-25 DIAGNOSIS — Z0000 Encounter for general adult medical examination without abnormal findings: Secondary | ICD-10-CM | POA: Diagnosis not present

## 2023-09-17 ENCOUNTER — Ambulatory Visit: Admitting: Urology

## 2023-09-17 ENCOUNTER — Encounter: Payer: Self-pay | Admitting: Urology

## 2023-09-17 VITALS — BP 116/71 | HR 84 | Ht 73.0 in | Wt 234.0 lb

## 2023-09-17 DIAGNOSIS — N433 Hydrocele, unspecified: Secondary | ICD-10-CM

## 2023-09-17 LAB — URINALYSIS, COMPLETE
Bilirubin, UA: NEGATIVE
Glucose, UA: NEGATIVE
Ketones, UA: NEGATIVE
Leukocytes,UA: NEGATIVE
Nitrite, UA: NEGATIVE
Protein,UA: NEGATIVE
RBC, UA: NEGATIVE
Specific Gravity, UA: 1.02 (ref 1.005–1.030)
Urobilinogen, Ur: 0.2 mg/dL (ref 0.2–1.0)
pH, UA: 6 (ref 5.0–7.5)

## 2023-09-17 LAB — MICROSCOPIC EXAMINATION

## 2023-09-17 NOTE — Progress Notes (Signed)
 I, Isaac Watkins, acting as a scribe for Isaac JAYSON Barba, MD., have documented all relevant documentation on the behalf of Isaac JAYSON Barba, MD, as directed by Isaac JAYSON Barba, MD while in the presence of Isaac JAYSON Barba, MD.  09/17/2023 4:39 PM   Isaac Watkins 1940/12/19 [id number]  Referring provider: Sharma Coyer, MD [address] [place],  [place] [address]  Chief Complaint  Patient presents with   Large testicle    HPI: Isaac Watkins is a 83 y.o. male referred for evaluation of scrotal enlargement.   Has noted scrotal enlargement over the last 6 months. Has mild discomfort but no significant pain.  Has BPH on tamsulosin .  Denies dysuria, gross hematuria.   PMH: Past Medical History:  Diagnosis Date   Actinic keratosis    Allergic rhinitis    Hyperlipidemia    Hypertension     Surgical History: Past Surgical History:  Procedure Laterality Date   CARPAL TUNNEL RELEASE Left    CATARACT EXTRACTION     TONSILECTOMY, ADENOIDECTOMY, BILATERAL MYRINGOTOMY AND TUBES      Home Medications:  Allergies as of 09/17/2023   No Known Allergies      Medication List        Accurate as of September 17, 2023  4:39 PM. If you have any questions, ask your nurse or doctor.          STOP taking these medications    predniSONE  2.5 MG tablet Commonly known as: DELTASONE  Stopped by: Isaac Watkins       TAKE these medications    amLODipine  5 MG tablet Commonly known as: NORVASC  TAKE 1 TABLET(5 MG) BY MOUTH DAILY   atorvastatin  40 MG tablet Commonly known as: LIPITOR Take 1 tablet (40 MG) by mouth daily at 6 PM   citalopram  20 MG tablet Commonly known as: CELEXA  TAKE 1 TABLET(20 MG) BY MOUTH DAILY   ipratropium-albuterol  0.5-2.5 (3) MG/3ML Soln Commonly known as: DUONEB Inhale into the lungs.   ketoconazole  2 % cream Commonly known as: NIZORAL  Apply 1 Application topically 3 (three) times a week. Apply to eyebrows  and between eyebrows 3 nights weekly   losartan  100 MG tablet Commonly known as: COZAAR  TAKE 1 TABLET(100 MG) BY MOUTH DAILY   metroNIDAZOLE  0.75 % gel Commonly known as: METROGEL  Apply 1 Application topically at bedtime. Apply to face nightly for Rosacea   tamsulosin  0.4 MG Caps capsule Commonly known as: FLOMAX  TAKE 2 CAPSULES(0.8 MG) BY MOUTH DAILY   Tiadylt  ER 300 MG 24 hr capsule Generic drug: diltiazem  TAKE 1 CAPSULE(300 MG) BY MOUTH DAILY        Allergies: No Known Allergies  Family History: Family History  Problem Relation Age of Onset   Pancreatic cancer Father    Pancreatic disease Father    COPD Father    Dementia Mother    Breast cancer Sister        BRCA 2 +   Brain cancer Paternal Grandfather    Heart disease Son    Heart attack Son    Prostate cancer Neg Hx    Bladder Cancer Neg Hx    Kidney cancer Neg Hx     Social History:  reports that he quit smoking about 12 years ago. His smoking use included cigarettes. He started smoking about 52 years ago. He has a 40 pack-year smoking history. He has never used smokeless tobacco. He reports current alcohol use of about 21.0 standard drinks  of alcohol per week. He reports that he does not use drugs.   Physical Exam: BP 116/71   Pulse 84   Ht 6' 1 (1.854 m)   Wt 234 lb (106.1 kg)   BMI 30.87 kg/m   Constitutional:  Alert and oriented, No acute distress. HEENT: [place] AT Respiratory: Normal respiratory effort, no increased work of breathing. GU: Phallus without lesion. Bilateral scrotal enlargement consistent with the hydroceles. Testes difficult to palpate. Psychiatric: Normal mood and affect.   Assessment & Plan:    1. Bilateral hydrocele Discussed the pathophysiology of hydroceles with the patient. Scheduled a scrotal ultrasound to evaluate for intratesticular abnormalities. The patient will be notified of the results. Discussed management options including scrotal support, hydrocelectomy, and  aspiration. The high recurrence rate of aspiration was also discussed.  I have reviewed the above documentation for accuracy and completeness, and I agree with the above.   Isaac JAYSON Barba, MD  [facility] [address] [place], [place] [address] [phone]

## 2023-09-17 NOTE — Patient Instructions (Signed)
 Scheduling number is [id number]

## 2023-09-18 ENCOUNTER — Other Ambulatory Visit: Payer: Self-pay | Admitting: Family Medicine

## 2023-09-21 ENCOUNTER — Ambulatory Visit
Admission: RE | Admit: 2023-09-21 | Discharge: 2023-09-21 | Disposition: A | Discharge status: Home / Self Care | Source: Ambulatory Visit | Attending: Urology | Admitting: Urology

## 2023-09-21 DIAGNOSIS — N433 Hydrocele, unspecified: Secondary | ICD-10-CM | POA: Insufficient documentation

## 2023-09-21 NOTE — Telephone Encounter (Signed)
 Requested medication (s) are due for refill today: yes  Requested medication (s) are on the active medication list: yes  Last refill:  03/02/23 #90 1 RF  Future visit scheduled: yes  Notes to clinic:  overdue labs   Requested Prescriptions  Pending Prescriptions Disp Refills   atorvastatin  (LIPITOR) 40 MG tablet [Pharmacy Med Name: ATORVASTATIN  40MG  TABLETS] 90 tablet 1    Sig: TAKE 1 TABLET(40 MG) BY MOUTH DAILY AT 6 PM     Cardiovascular:  Antilipid - Statins Failed - 09/21/2023  2:27 PM      Failed - Lipid Panel in normal range within the last 12 months    Cholesterol, Total  Date Value Ref Range Status  09/09/2022 157 100 - 199 mg/dL Final   LDL Chol Calc (NIH)  Date Value Ref Range Status  09/09/2022 63 0 - 99 mg/dL Final   HDL  Date Value Ref Range Status  09/09/2022 73 >39 mg/dL Final   Triglycerides  Date Value Ref Range Status  09/09/2022 121 0 - 149 mg/dL Final         Passed - Patient is not pregnant      Passed - Valid encounter within last 12 months    Recent Outpatient Visits           1 month ago Large testicle   [facility] Simmons-Robinson, [place], MD       Future Appointments             In 1 month Bacigalupo, Jon HERO, MD [facility]   In 4 months Hester Alm BROCKS, MD [facility]

## 2023-09-22 ENCOUNTER — Ambulatory Visit: Payer: Self-pay | Admitting: Urology

## 2023-10-20 ENCOUNTER — Telehealth: Payer: Self-pay

## 2023-10-20 NOTE — Telephone Encounter (Signed)
 Copied from CRM [id number]. Topic: General - Other >> Oct 19, 2023  5:04 PM Delon HERO wrote: Reason for CRM: Patient is calling to report a patient is no longer a patient of Dr. KATHEE.

## 2023-10-27 ENCOUNTER — Encounter: Payer: Self-pay | Admitting: Family Medicine

## 2023-11-05 ENCOUNTER — Other Ambulatory Visit: Payer: Self-pay | Admitting: Family Medicine

## 2023-11-05 DIAGNOSIS — I10 Essential (primary) hypertension: Secondary | ICD-10-CM

## 2023-11-05 DIAGNOSIS — N289 Disorder of kidney and ureter, unspecified: Secondary | ICD-10-CM

## 2023-12-07 ENCOUNTER — Telehealth: Payer: Self-pay | Admitting: Family Medicine

## 2023-12-07 DIAGNOSIS — N401 Enlarged prostate with lower urinary tract symptoms: Secondary | ICD-10-CM

## 2023-12-07 NOTE — Telephone Encounter (Signed)
[facility] faxed refill request for the following medications:  tamsulosin (FLOMAX) 0.4 MG CAPS capsule   Please advise.

## 2023-12-09 MED ORDER — TAMSULOSIN HCL 0.4 MG PO CAPS
0.8000 mg | ORAL_CAPSULE | Freq: Every day | ORAL | 1 refills | Status: AC
Start: 2023-12-09 — End: ?

## 2023-12-19 ENCOUNTER — Other Ambulatory Visit: Payer: Self-pay | Admitting: Family Medicine

## 2023-12-28 ENCOUNTER — Telehealth: Payer: Self-pay | Admitting: Family Medicine

## 2023-12-28 NOTE — Telephone Encounter (Signed)
 [facility] faxed refill request for the following medications:  diltiazem  (TIADYLT  ER) 300 MG 24 hr capsule    Please advise.

## 2023-12-29 ENCOUNTER — Other Ambulatory Visit: Payer: Self-pay

## 2023-12-29 MED ORDER — DILTIAZEM HCL ER BEADS 300 MG PO CP24: 300.0000 mg | ORAL_CAPSULE | Freq: Every day | ORAL | 0 refills | Status: DC

## 2023-12-29 NOTE — Telephone Encounter (Signed)
 Converted

## 2024-01-27 ENCOUNTER — Ambulatory Visit: Payer: Medicare Other | Admitting: Dermatology

## 2024-01-27 ENCOUNTER — Encounter: Payer: Self-pay | Admitting: Dermatology

## 2024-01-27 DIAGNOSIS — L718 Other rosacea: Secondary | ICD-10-CM

## 2024-01-27 DIAGNOSIS — L820 Inflamed seborrheic keratosis: Secondary | ICD-10-CM

## 2024-01-27 DIAGNOSIS — L578 Other skin changes due to chronic exposure to nonionizing radiation: Secondary | ICD-10-CM | POA: Diagnosis not present

## 2024-01-27 DIAGNOSIS — Z79899 Other long term (current) drug therapy: Secondary | ICD-10-CM

## 2024-01-27 DIAGNOSIS — Z7189 Other specified counseling: Secondary | ICD-10-CM

## 2024-01-27 DIAGNOSIS — L570 Actinic keratosis: Secondary | ICD-10-CM | POA: Diagnosis not present

## 2024-01-27 DIAGNOSIS — D692 Other nonthrombocytopenic purpura: Secondary | ICD-10-CM

## 2024-01-27 DIAGNOSIS — W908XXA Exposure to other nonionizing radiation, initial encounter: Secondary | ICD-10-CM

## 2024-01-27 DIAGNOSIS — L711 Rhinophyma: Secondary | ICD-10-CM

## 2024-01-27 DIAGNOSIS — L219 Seborrheic dermatitis, unspecified: Secondary | ICD-10-CM | POA: Diagnosis not present

## 2024-01-27 DIAGNOSIS — L719 Rosacea, unspecified: Secondary | ICD-10-CM

## 2024-01-27 DIAGNOSIS — L821 Other seborrheic keratosis: Secondary | ICD-10-CM | POA: Diagnosis not present

## 2024-01-27 MED ORDER — KETOCONAZOLE 2 % EX CREA: 1.0000 | TOPICAL_CREAM | CUTANEOUS | 11 refills | Status: AC

## 2024-01-27 MED ORDER — METRONIDAZOLE 0.75 % EX GEL: 1.0000 | Freq: Every evening | CUTANEOUS | 11 refills | Status: AC

## 2024-01-27 NOTE — Progress Notes (Signed)
 Follow-Up Visit   Subjective  Isaac Watkins is a 83 y.o. male who presents for the following: spot check. Hx of AKs. No Hx of skin cancer. Hx of Rosacea, uses Metronidazole  gel. Hx of seb derm, uses Ketoconazole  cream as directed.  The patient has spots, moles and lesions to be evaluated, some may be new or changing and the patient may have concern these could be cancer. Pt declines skin cancer screening and TBSE today.  The following portions of the chart were reviewed this encounter and updated as appropriate: medications, allergies, medical history  Review of Systems:  No other skin or systemic complaints except as noted in HPI or Assessment and Plan.  Objective  Well appearing patient in no apparent distress; mood and affect are within normal limits.  A focused examination was performed of the following areas: Scalp, face, ears, neck, arms, hands  Relevant exam findings are noted in the Assessment and Plan.  nasal tip x1 Erythematous thin papules/macules with gritty scale.  R temple x1, R preauricular x1, L cheek x4 (6) Erythematous keratotic or waxy stuck-on papule or plaque.  Assessment & Plan   ACTINIC DAMAGE - chronic, secondary to cumulative UV radiation exposure/sun exposure over time - diffuse scaly erythematous macules with underlying dyspigmentation - Recommend daily broad spectrum sunscreen SPF 30+ to sun-exposed areas, reapply every 2 hours as needed.  - Recommend staying in the shade or wearing long sleeves, sun glasses (UVA+UVB protection) and wide brim hats (4-inch brim around the entire circumference of the hat). - Call for new or changing lesions.   SEBORRHEIC KERATOSIS - Stuck-on, waxy, tan-brown papules and/or plaques  - Benign-appearing - Discussed benign etiology and prognosis. - Observe - Call for any changes  Purpura - Chronic; persistent and recurrent.  Treatable, but not curable. - Violaceous macules and patches - Benign - Related to  trauma, age, sun damage and/or use of blood thinners, chronic use of topical and/or oral steroids - Observe - Can use OTC arnica containing moisturizer such as Dermend Bruise Formula if desired - Call for worsening or other concerns  SEBORRHEIC DERMATITIS Sternum, face, post auriculars Exam: Pink scaly patches Chronic and persistent condition with duration or expected duration over one year. Condition is symptomatic / bothersome to patient. Not to goal. Seborrheic Dermatitis is a chronic persistent rash characterized by pinkness and scaling most commonly of the mid face but also can occur on the scalp (dandruff), ears; mid chest, mid back and groin.  It tends to be exacerbated by stress and cooler weather.  People who have neurologic disease may experience new onset or exacerbation of existing seborrheic dermatitis.  The condition is not curable but treatable and can be controlled. Treatment Plan: Cont Ketoconazole  2% cr 3d/wk to face, post auriculars Long term medication management.  Patient is using long term (months to years) prescription medication  to control their dermatologic condition.  These medications require periodic monitoring to evaluate for efficacy and side effects and may require periodic laboratory monitoring.    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 Condition is symptomatic/ bothersome to patient. Not currently at goal.  Rosacea is a chronic progressive skin condition usually affecting the face of adults, causing redness and/or acne bumps. It is treatable but not curable. It sometimes affects the eyes (ocular  rosacea) as well. It may respond to topical and/or systemic medication and can flare with stress, sun exposure, alcohol , exercise, topical steroids  (including hydrocortisone/cortisone 10) and some foods.  Daily application of broad spectrum spf 30+ sunscreen to face is recommended to reduce flares. Treatment Plan Continue Metrogel  0.75% qhs to face Long term medication management.  Patient is using long term (months to years) prescription medication  to control their dermatologic condition.  These medications require periodic monitoring to evaluate for efficacy and side effects and may require periodic laboratory monitoring.  AK (ACTINIC KERATOSIS) nasal tip x1 Actinic keratoses are precancerous spots that appear secondary to cumulative UV radiation exposure/sun exposure over time. They are chronic with expected duration over 1 year. A portion of actinic keratoses will progress to squamous cell carcinoma of the skin. It is not possible to reliably predict which spots will progress to skin cancer and so treatment is recommended to prevent development of skin cancer.  Recommend daily broad spectrum sunscreen SPF 30+ to sun-exposed areas, reapply every 2 hours as needed.  Recommend staying in the shade or wearing long sleeves, sun glasses (UVA+UVB protection) and wide brim hats (4-inch brim around the entire circumference of the hat). Call for new or changing lesions. Destruction of lesion - nasal tip x1 Complexity: simple   Destruction method: cryotherapy   Informed consent: discussed and consent obtained   Timeout:  patient name, date of birth, surgical site, and procedure verified Lesion destroyed using liquid nitrogen: Yes   Region frozen until ice ball extended beyond lesion: Yes   Outcome: patient tolerated procedure well with no complications   Post-procedure details: wound care instructions given   Additional details:  Prior to procedure, discussed risks of blister formation, small wound, skin dyspigmentation, or rare scar following cryotherapy. Recommend Vaseline ointment to treated areas while healing.   INFLAMED SEBORRHEIC  KERATOSIS (6) R temple x1, R preauricular x1, L cheek x4 (6) Symptomatic, irritating, patient would like treated. Destruction of lesion - R temple x1, R preauricular x1, L cheek x4 (6) Complexity: simple   Destruction method: cryotherapy   Informed consent: discussed and consent obtained   Timeout:  patient name, date of birth, surgical site, and procedure verified Lesion destroyed using liquid nitrogen: Yes   Region frozen until ice ball extended beyond lesion: Yes   Outcome: patient tolerated procedure well with no complications   Post-procedure details: wound care instructions given   Additional details:  Prior to procedure, discussed risks of blister formation, small wound, skin dyspigmentation, or rare scar following cryotherapy. Recommend Vaseline ointment to treated areas while healing.    Return in about 1 year (around 01/26/2025) for AK Follow Up.  I, Kate Fought, CMA, am acting as scribe for Alm Rhyme, MD.   Documentation: I have reviewed the above documentation for accuracy and completeness, and I agree with the above.  Alm Rhyme, MD

## 2024-01-27 NOTE — Patient Instructions (Addendum)
 Cryotherapy Aftercare  Wash gently with soap and water everyday.   Apply Vaseline Jelly daily until healed.    Recommend daily broad spectrum sunscreen SPF 30+ to sun-exposed areas, reapply every 2 hours as needed. Call for new or changing lesions.  Staying in the shade or wearing long sleeves, sun glasses (UVA+UVB protection) and wide brim hats (4-inch brim around the entire circumference of the hat) are also recommended for sun protection.     Continue Metrogel  0.75% to face at bedtime.   Continue Ketoconazole  2% cr 3d/wk to face, post auriculars      Due to recent changes in healthcare laws, you may see results of your pathology and/or laboratory studies on MyChart before the doctors have had a chance to review them. We understand that in some cases there may be results that are confusing or concerning to you. Please understand that not all results are received at the same time and often the doctors may need to interpret multiple results in order to provide you with the best plan of care or course of treatment. Therefore, we ask that you please give us  2 business days to thoroughly review all your results before contacting the office for clarification. Should we see a critical lab result, you will be contacted sooner.   If You Need Anything After Your Visit  If you have any questions or concerns for your doctor, please call our main line at [phone] and press option 4 to reach your doctor's medical assistant. If no one answers, please leave a voicemail as directed and we will return your call as soon as possible. Messages left after 4 pm will be answered the following business day.   You may also send us  a message via MyChart. We typically respond to MyChart messages within 1-2 business days.  For prescription refills, please ask your pharmacy to contact our office. Our fax number is [phone].  If you have an urgent issue when the clinic is closed that cannot wait until the  next business day, you can page your doctor at the number below.    Please note that while we do our best to be available for urgent issues outside of office hours, we are not available 24/7.   If you have an urgent issue and are unable to reach us , you may choose to seek medical care at your doctor's office, retail clinic, urgent care center, or emergency room.  If you have a medical emergency, please immediately call 911 or go to the emergency department.  Pager Numbers  - Dr. Hester: [phone]  - Dr. Jackquline: [phone]  - Dr. Claudene: [phone]   - Dr. Raymund: [phone]  In the event of inclement weather, please call our main line at [phone] for an update on the status of any delays or closures.  Dermatology Medication Tips: Please keep the boxes that topical medications come in in order to help keep track of the instructions about where and how to use these. Pharmacies typically print the medication instructions only on the boxes and not directly on the medication tubes.   If your medication is too expensive, please contact our office at [phone] option 4 or send us  a message through MyChart.   We are unable to tell what your co-pay for medications will be in advance as this is different depending on your insurance coverage. However, we may be able to find a substitute medication at lower cost or fill out paperwork to get insurance to cover a needed  medication.   If a prior authorization is required to get your medication covered by your insurance company, please allow us  1-2 business days to complete this process.  Drug prices often vary depending on where the prescription is filled and some pharmacies may offer cheaper prices.  The website www.goodrx.com contains coupons for medications through different pharmacies. The prices here do not account for what the cost may be with help from insurance (it may be cheaper with your insurance), but the website can give  you the price if you did not use any insurance.  - You can print the associated coupon and take it with your prescription to the pharmacy.  - You may also stop by our office during regular business hours and pick up a GoodRx coupon card.  - If you need your prescription sent electronically to a different pharmacy, notify our office through [facility] or by phone at [phone] option 4.     Si Usted Necesita Algo Despus de Su Visita  Tambin puede enviarnos un mensaje a travs de Clinical Cytogeneticist. Por lo general respondemos a los mensajes de MyChart en el transcurso de 1 a 2 das hbiles.  Para renovar recetas, por favor pida a su farmacia que se ponga en contacto con nuestra oficina. Randi lakes de fax es [place] [phone].  Si tiene un asunto urgente cuando la clnica est cerrada y que no puede esperar hasta el siguiente da hbil, puede llamar/localizar a su doctor(a) al nmero que aparece a continuacin.   Por favor, tenga en cuenta que aunque hacemos todo lo posible para estar disponibles para asuntos urgentes fuera del horario de [place], no estamos disponibles las 24 horas del da, los 7 [address]  [address] de la [place].   Si tiene un problema urgente y no puede comunicarse con nosotros, puede optar por buscar atencin mdica  en el consultorio de su doctor(a), en una clnica privada, en un centro de atencin urgente o en una sala de emergencias.  Si tiene engineer, drilling, por favor llame inmediatamente al 911 o vaya a la sala de emergencias.  Nmeros de bper  - Dr. Hester: [phone]  - Dra. Jackquline: [phone]  - Dr. Claudene: [phone]  - Dra. Kitts: [phone]  En caso de inclemencias del [place], por favor llame a nuestra lnea principal al [phone] para una actualizacin sobre el estado de cualquier retraso o cierre.  Consejos para la medicacin en dermatologa: Por favor, guarde las cajas en las que vienen los medicamentos de uso tpico para ayudarle a seguir  las instrucciones sobre dnde y cmo usarlos. Las farmacias generalmente imprimen las instrucciones del medicamento slo en las cajas y no directamente en los tubos del [place].   Si su medicamento es muy caro, por favor, pngase en contacto con landry rieger llamando al [phone] y presione la opcin 4 o envenos un mensaje a travs de Clinical Cytogeneticist.   No podemos decirle cul ser su copago por los medicamentos por adelantado ya que esto es diferente dependiendo de la cobertura de su seguro. Sin embargo, es posible que podamos encontrar un medicamento sustituto a audiological scientist un formulario para que el seguro cubra el medicamento que se considera necesario.   Si se requiere una autorizacin previa para que su compaa de seguros cubra su medicamento, por favor permtanos de 1 a 2 das hbiles para completar este proceso.  Los precios de los medicamentos varan con frecuencia dependiendo del environmental consultant de dnde se surte la receta y careers adviser pueden ofrecer  precios ms baratos.  El sitio web www.goodrx.com tiene cupones para medicamentos de health and safety inspector. Los precios aqu no tienen en cuenta lo que podra costar con la ayuda del seguro (puede ser ms barato con su seguro), pero el sitio web puede darle el precio si no utiliz tourist information centre manager.  - Puede imprimir el cupn correspondiente y llevarlo con su receta a la farmacia.  - Tambin puede pasar por nuestra oficina durante el horario de atencin regular y education officer, museum una tarjeta de cupones de GoodRx.  - Si necesita que su receta se enve electrnicamente a una farmacia diferente, informe a nuestra oficina a travs de MyChart de [place] o por telfono llamando al [phone] y presione la opcin 4.

## 2024-02-01 ENCOUNTER — Encounter: Payer: Self-pay | Admitting: Dermatology

## 2024-04-13 ENCOUNTER — Other Ambulatory Visit: Payer: Self-pay

## 2024-04-13 ENCOUNTER — Telehealth: Payer: Self-pay | Admitting: Family Medicine

## 2024-04-13 MED ORDER — ATORVASTATIN CALCIUM 40 MG PO TABS: ORAL_TABLET | ORAL | 0 refills | Status: AC

## 2024-04-13 MED ORDER — DILTIAZEM HCL ER BEADS 300 MG PO CP24: 300.0000 mg | ORAL_CAPSULE | Freq: Every day | ORAL | 0 refills | Status: AC

## 2024-04-13 NOTE — Telephone Encounter (Signed)
 Med was sent in

## 2024-04-13 NOTE — Telephone Encounter (Addendum)
 [facility] faxed refill request for the following medications:  diltiazem  (TIADYLT  ER) 300 MG 24 hr capsule   atorvastatin  (LIPITOR) 40 MG tablet    Please advise.

## 2025-01-26 ENCOUNTER — Ambulatory Visit: Admitting: Dermatology
